# Patient Record
Sex: Female | Born: 1935 | Hispanic: No | Marital: Single | Smoking: Never smoker
Health system: Southern US, Community
[De-identification: ages and names within clinical notes are randomized; demographics above are authoritative.]

---

## 2002-08-09 ENCOUNTER — Other Ambulatory Visit: Admission: RE | Admit: 2002-08-09 | Discharge: 2002-08-09 | Payer: Self-pay | Admitting: Dermatology

## 2003-06-06 ENCOUNTER — Other Ambulatory Visit: Admission: RE | Admit: 2003-06-06 | Discharge: 2003-06-06 | Payer: Self-pay | Admitting: Dermatology

## 2017-05-13 ENCOUNTER — Emergency Department (HOSPITAL_COMMUNITY): Payer: Medicare Other

## 2017-05-13 ENCOUNTER — Encounter (HOSPITAL_COMMUNITY): Payer: Self-pay | Admitting: Family Medicine

## 2017-05-13 ENCOUNTER — Other Ambulatory Visit: Payer: Self-pay

## 2017-05-13 ENCOUNTER — Inpatient Hospital Stay (HOSPITAL_COMMUNITY)
Admission: EM | Admit: 2017-05-13 | Discharge: 2017-05-19 | DRG: 603 | Disposition: A | Discharge status: Skilled Nursing Facility | Payer: Medicare Other | Attending: Internal Medicine | Admitting: Internal Medicine

## 2017-05-13 DIAGNOSIS — R7989 Other specified abnormal findings of blood chemistry: Secondary | ICD-10-CM

## 2017-05-13 DIAGNOSIS — I10 Essential (primary) hypertension: Secondary | ICD-10-CM | POA: Diagnosis present

## 2017-05-13 DIAGNOSIS — I4891 Unspecified atrial fibrillation: Secondary | ICD-10-CM | POA: Diagnosis not present

## 2017-05-13 DIAGNOSIS — L539 Erythematous condition, unspecified: Secondary | ICD-10-CM

## 2017-05-13 DIAGNOSIS — R609 Edema, unspecified: Secondary | ICD-10-CM

## 2017-05-13 DIAGNOSIS — N289 Disorder of kidney and ureter, unspecified: Secondary | ICD-10-CM | POA: Diagnosis not present

## 2017-05-13 DIAGNOSIS — I2510 Atherosclerotic heart disease of native coronary artery without angina pectoris: Secondary | ICD-10-CM | POA: Diagnosis present

## 2017-05-13 DIAGNOSIS — L0390 Cellulitis, unspecified: Secondary | ICD-10-CM | POA: Diagnosis present

## 2017-05-13 DIAGNOSIS — N179 Acute kidney failure, unspecified: Secondary | ICD-10-CM | POA: Diagnosis present

## 2017-05-13 DIAGNOSIS — L03115 Cellulitis of right lower limb: Secondary | ICD-10-CM | POA: Diagnosis present

## 2017-05-13 DIAGNOSIS — I482 Chronic atrial fibrillation: Secondary | ICD-10-CM | POA: Diagnosis present

## 2017-05-13 DIAGNOSIS — E875 Hyperkalemia: Secondary | ICD-10-CM | POA: Diagnosis present

## 2017-05-13 DIAGNOSIS — E860 Dehydration: Secondary | ICD-10-CM | POA: Diagnosis present

## 2017-05-13 DIAGNOSIS — D696 Thrombocytopenia, unspecified: Secondary | ICD-10-CM | POA: Diagnosis present

## 2017-05-13 DIAGNOSIS — E872 Acidosis: Secondary | ICD-10-CM | POA: Diagnosis present

## 2017-05-13 LAB — CBC WITH DIFFERENTIAL/PLATELET
Basophils Absolute: 0 10*3/uL (ref 0.0–0.1)
Basophils Relative: 0 %
EOS PCT: 0 %
Eosinophils Absolute: 0 10*3/uL (ref 0.0–0.7)
HCT: 45.3 % (ref 36.0–46.0)
HEMOGLOBIN: 14.7 g/dL (ref 12.0–15.0)
LYMPHS ABS: 0.3 10*3/uL — AB (ref 0.7–4.0)
LYMPHS PCT: 2 %
MCH: 31.1 pg (ref 26.0–34.0)
MCHC: 32.5 g/dL (ref 30.0–36.0)
MCV: 96 fL (ref 78.0–100.0)
Monocytes Absolute: 0.7 10*3/uL (ref 0.1–1.0)
Monocytes Relative: 6 %
Neutro Abs: 11.3 10*3/uL — ABNORMAL HIGH (ref 1.7–7.7)
Neutrophils Relative %: 92 %
PLATELETS: 140 10*3/uL — AB (ref 150–400)
RBC: 4.72 MIL/uL (ref 3.87–5.11)
RDW: 16 % — ABNORMAL HIGH (ref 11.5–15.5)
WBC: 12.3 10*3/uL — AB (ref 4.0–10.5)

## 2017-05-13 LAB — BASIC METABOLIC PANEL
Anion gap: 8 (ref 5–15)
BUN: 33 mg/dL — AB (ref 6–20)
CHLORIDE: 103 mmol/L (ref 101–111)
CO2: 26 mmol/L (ref 22–32)
Calcium: 9.5 mg/dL (ref 8.9–10.3)
Creatinine, Ser: 1.75 mg/dL — ABNORMAL HIGH (ref 0.44–1.00)
GFR calc Af Amer: 28 mL/min — ABNORMAL LOW (ref >60)
GFR calc non Af Amer: 24 mL/min — ABNORMAL LOW (ref >60)
GLUCOSE: 87 mg/dL (ref 65–99)
POTASSIUM: 5.1 mmol/L (ref 3.5–5.1)
Sodium: 137 mmol/L (ref 135–145)

## 2017-05-13 LAB — I-STAT CG4 LACTIC ACID, ED: Lactic Acid, Venous: 4.83 mmol/L (ref 0.5–1.9)

## 2017-05-13 MED ORDER — ACETAMINOPHEN 325 MG PO TABS
650.0000 mg | ORAL_TABLET | Freq: Four times a day (QID) | ORAL | Status: DC | PRN
  Administered 2017-05-16 – 2017-05-18 (×5): 650 mg via ORAL
  Filled 2017-05-13 (×5): qty 2

## 2017-05-13 MED ORDER — SENNOSIDES-DOCUSATE SODIUM 8.6-50 MG PO TABS: 1.0000 | ORAL_TABLET | Freq: Every evening | ORAL | Status: DC | PRN

## 2017-05-13 MED ORDER — HYDROCODONE-ACETAMINOPHEN 5-325 MG PO TABS
1.0000 | ORAL_TABLET | ORAL | Status: DC | PRN
  Administered 2017-05-14 – 2017-05-15 (×3): 1 via ORAL
  Administered 2017-05-16: 2 via ORAL
  Administered 2017-05-16 (×2): 1 via ORAL
  Administered 2017-05-17 (×2): 2 via ORAL
  Filled 2017-05-13 (×3): qty 1
  Filled 2017-05-13: qty 2
  Filled 2017-05-13 (×3): qty 1
  Filled 2017-05-13 (×2): qty 2

## 2017-05-13 MED ORDER — PIPERACILLIN-TAZOBACTAM 3.375 G IVPB
3.3750 g | Freq: Three times a day (TID) | INTRAVENOUS | Status: DC
  Administered 2017-05-13 – 2017-05-16 (×9): 3.375 g via INTRAVENOUS
  Filled 2017-05-13 (×9): qty 50

## 2017-05-13 MED ORDER — APIXABAN 2.5 MG PO TABS
2.5000 mg | ORAL_TABLET | Freq: Two times a day (BID) | ORAL | Status: DC
  Administered 2017-05-14 – 2017-05-19 (×11): 2.5 mg via ORAL
  Filled 2017-05-13 (×11): qty 1

## 2017-05-13 MED ORDER — BISACODYL 5 MG PO TBEC: 5.0000 mg | DELAYED_RELEASE_TABLET | Freq: Every day | ORAL | Status: DC | PRN

## 2017-05-13 MED ORDER — SODIUM CHLORIDE 0.9 % IV SOLN
INTRAVENOUS | Status: AC
  Administered 2017-05-14: 01:00:00 via INTRAVENOUS

## 2017-05-13 MED ORDER — ACETAMINOPHEN 650 MG RE SUPP: 650.0000 mg | Freq: Four times a day (QID) | RECTAL | Status: DC | PRN

## 2017-05-13 MED ORDER — HEPARIN SODIUM (PORCINE) 5000 UNIT/ML IJ SOLN: 5000.0000 [IU] | Freq: Three times a day (TID) | INTRAMUSCULAR | Status: DC

## 2017-05-13 MED ORDER — ONDANSETRON HCL 4 MG PO TABS: 4.0000 mg | ORAL_TABLET | Freq: Four times a day (QID) | ORAL | Status: DC | PRN

## 2017-05-13 MED ORDER — ONDANSETRON HCL 4 MG/2ML IJ SOLN: 4.0000 mg | Freq: Four times a day (QID) | INTRAMUSCULAR | Status: DC | PRN

## 2017-05-13 MED ORDER — SODIUM CHLORIDE 0.9 % IV BOLUS (SEPSIS)
1000.0000 mL | Freq: Once | INTRAVENOUS | Status: AC
  Administered 2017-05-13: 1000 mL via INTRAVENOUS

## 2017-05-13 MED ORDER — VANCOMYCIN HCL IN DEXTROSE 1-5 GM/200ML-% IV SOLN
1000.0000 mg | Freq: Once | INTRAVENOUS | Status: AC
  Administered 2017-05-13: 1000 mg via INTRAVENOUS
  Filled 2017-05-13: qty 200

## 2017-05-13 NOTE — Progress Notes (Signed)
Pharmacy Antibiotic Note  Lori Whitney is a L[age] Peck[age] y.o. female admitted on 05/13/2017 with cellulitis.  Pharmacy has been consulted for Vancomycin/Zosyn dosing. WBC elevated. Noted renal dysfunction. Lactic acid elevated.   Plan: Vancomycin 1000 mg IV q48h, change to q24h interval if renal function improves  Zosyn 3.375G IV q8h to be infused over 4 hours Trend WBC, temp, renal function  F/U infectious work-up Drug levels as indicated   Height: 5\' 7"  (170.2 cm) Weight: 140 lb (63.5 kg) IBW/kg (Calculated) : 61.6  Temp (24hrs), Avg:96.9 F (36.1 C), Min:96 F (35.6 C), Max:97.8 F (36.6 C)  Recent Labs  Lab 05/13/17 2143 05/13/17 2205  WBC 12.3*  --   CREATININE 1.75*  --   LATICACIDVEN  --  4.83*    Estimated Creatinine Clearance: 20.4 mL/min (A) (by C-G formula based on SCr of 1.75 mg/dL (H)).    No Known Allergies  Abran DukeLedford, Alisyn Lequire 05/13/2017 11:58 PM

## 2017-05-13 NOTE — H&P (Signed)
History and Physical    MATILYNN DACEY ZOX:[id number] DOB: 05-06-1924 DOA: 05/13/2017  PCP: Patient, No Pcp Per   Patient coming from: Home  Chief Complaint: RLE redness, swelling, and pain  HPI: Lori Whitney is a 83 y.o. female with medical history significant for coronary artery disease and atrial fibrillation, previously on Eliquis until [facility] last year, now presenting to the emergency department for evaluation of progressive swelling, erythema, and pain involving the right lower extremity.  Patient has had poorly healing ulcerations to bilateral lower extremities, was recently evaluated in outside hospital with negative venous Dopplers, and was recommended to follow-up with vascular surgery.  She now presents with concern for progression of erythema approximately, from the lower leg, now up into the groin.  There has not been any fevers associated with this, but there is increasing pain, swelling, and drainage.  ED Course: Upon arrival to the ED, patient is found to be afebrile, saturating well on room air, and with vitals otherwise stable.  Radiographs of the right lower extremity notable for soft tissue edema, but no underlying osseous abnormality.  Chemistry panel reveals a creatinine of 1.75, up from 1.1 in 2017.  CBC is notable for leukocytosis of 12,300 and slight thrombocytopenia.  Lactic acid is elevated to 4.83.  Blood cultures were collected and the patient was given a dose of vancomycin in the ED.  She remained hemodynamically stable, and no apparent respiratory distress, and will be admitted to the medical/surgical unit for ongoing evaluation and management of nonhealing lower extremity wounds with superimposed cellulitis.  Review of Systems:  All other systems reviewed and apart from HPI, are negative.  History reviewed. No pertinent past medical history.  History reviewed. No pertinent surgical history.   has no tobacco, alcohol, and drug history on file.  No Known  Allergies  History reviewed. No pertinent family history.   Prior to Admission medications   Not on File    Physical Exam: Vitals:   05/13/17 2145 05/13/17 2200 05/13/17 2215 05/13/17 2230  BP: 113/63 117/76 110/70 101/68  Pulse: 84 85 85 81  Resp:    16  Temp:      TempSrc:      SpO2: 97% 98% 99% 97%  Weight:      Height:          Constitutional: NAD, calm, comfortable Eyes: PERTLA, lids and conjunctivae normal ENMT: Mucous membranes are moist. Posterior pharynx clear of any exudate or lesions.   Neck: normal, supple, no masses, no thyromegaly Respiratory: clear to auscultation bilaterally, no wheezing, no crackles. Normal respiratory effort. No accessory muscle use.  Cardiovascular: S1 & S2 heard, regular rate and rhythm. No significant JVD. Abdomen: No distension, no tenderness, no masses palpated. Bowel sounds normal.  Musculoskeletal: no clubbing / cyanosis. No joint deformity upper and lower extremities.    Skin: Erythema and edema to bilateral lower legs Rt >> Lt. Ulceration to anterior lower right leg with bullae and sloughing. Skin otherwise warm, dry, well-perfused. Neurologic: Gross hearing deficit, CN II-XII otherwise intact. Sensation intact. Strength 5/5 in all 4 limbs.  Psychiatric: Alert and oriented x 3. Very pleasant, cooperative.    Labs on Admission: I have personally reviewed following labs and imaging studies  CBC: Recent Labs  Lab 05/13/17 2143  WBC 12.3*  NEUTROABS 11.3*  HGB 14.7  HCT 45.3  MCV 96.0  PLT 140*   Basic Metabolic Panel: Recent Labs  Lab 05/13/17 2143  NA 137  K 5.1  CL 103  CO2 26  GLUCOSE 87  BUN 33*  CREATININE 1.75*  CALCIUM 9.5   GFR: Estimated Creatinine Clearance: 20.4 mL/min (A) (by C-G formula based on SCr of 1.75 mg/dL (H)). Liver Function Tests: No results for input(s): AST, ALT, ALKPHOS, BILITOT, PROT, ALBUMIN in the last 168 hours. No results for input(s): LIPASE, AMYLASE in the last 168 hours. No  results for input(s): AMMONIA in the last 168 hours. Coagulation Profile: No results for input(s): INR, PROTIME in the last 168 hours. Cardiac Enzymes: No results for input(s): CKTOTAL, CKMB, CKMBINDEX, TROPONINI in the last 168 hours. BNP (last 3 results) No results for input(s): PROBNP in the last 8760 hours. HbA1C: No results for input(s): HGBA1C in the last 72 hours. CBG: No results for input(s): GLUCAP in the last 168 hours. Lipid Profile: No results for input(s): CHOL, HDL, LDLCALC, TRIG, CHOLHDL, LDLDIRECT in the last 72 hours. Thyroid Function Tests: No results for input(s): TSH, T4TOTAL, FREET4, T3FREE, THYROIDAB in the last 72 hours. Anemia Panel: No results for input(s): VITAMINB12, FOLATE, FERRITIN, TIBC, IRON, RETICCTPCT in the last 72 hours. Urine analysis: No results found for: COLORURINE, APPEARANCEUR, LABSPEC, PHURINE, GLUCOSEU, HGBUR, BILIRUBINUR, KETONESUR, PROTEINUR, UROBILINOGEN, NITRITE, LEUKOCYTESUR Sepsis Labs: @LABRCNTIP (procalcitonin:4,lacticidven:4) )No results found for this or any previous visit (from the past 240 hour(s)).   Radiological Exams on Admission: Dg Tibia/fibula Right Port  Result Date: 05/13/2017 CLINICAL DATA:  Right leg erythema. Right leg swelling and increased redness. Recent evaluation at outside institution for same. EXAM: PORTABLE RIGHT TIBIA AND FIBULA - 2 VIEW COMPARISON:  None. FINDINGS: There is no evidence of fracture or other focal bone lesions. Degenerative changes of the knee with chondrocalcinosis. No bony destructive change. Diffuse soft tissue edema with more focal soft tissue prominence about the medial soft tissues of the distal lower leg. There are vascular calcifications. No definite soft tissue air. IMPRESSION: Soft tissue edema. More focal soft tissue prominence about the medial distal lower leg is nonspecific, could reflect abscess in the appropriate clinical setting. No acute osseous abnormalities. Electronically Signed    By: Rubye OaksMelanie  Ehinger M.D.   On: 05/13/2017 22:29    EKG: Not performed.   Assessment/Plan  1. Cellulitis  - Pt presents with non-healing lower leg wounds and increasing pain, redness, and drainage from RLE  - She is found to have leukocytosis and and elevated lactic acid  - Venous dopplers reportedly negative for DVT at outside hospital 2 days prior to admission  - Blood cultures collected in ED and vancomycin administered  - Given the extensive skin sloughing and lactic acidosis, she will be continued on empiric abx with vancomycin and Zosyn initially  - Check ABI's, consult wound care, trend lactate and clinical response to therapy  2. Renal insufficiency  - SCr is 1.75 on admission, up from 1.1 range in 2017 with no recent values available  - There is acute infection with elevated lactic acid and patient will be hydrated with normal saline  - Renally-dose medications as needed, avoid nephrotoxins where possible, repeat chem panel in am   3. Atrial fibrillation  - In a regular rhythm on admission   - CHADS-VASc at least 424 (age x2, CAD, gender) - [facility]    DVT prophylaxis: sq heparin  Code Status: Full  Family Communication: Son updated at bedside Disposition Plan: Admit to med-surg Consults called: None Admission status: Inpatient    Briscoe Deutscherimothy S Khloie Hamada, MD [facility] Pager [phone]  If 7PM-7AM,  please contact night-coverage [email or web address] Password [email or web address]  05/13/2017, 11:13 PM

## 2017-05-13 NOTE — ED Provider Notes (Signed)
[facility] Provider Note   CSN: [id number] Arrival date & time: 05/13/17  2016    History   Chief Complaint Chief Complaint  Patient presents with  . Leg Swelling    HPI Lori Whitney is a 83 y.o. female.  83 y/o female presents to the ED for worsening BLE swelling and weeping, R>L. Son reports this to be progressive over the last few weeks. She had a large amount of drainage from her legs 2 days ago and [facility]. Son states that the patient had an ultrasound which was negative for blood clot. He became concerned about increased redness to the RLE today, now streaking up the inner thigh, though there has been a degree of erythema for some time. No documented fevers. Patient notes pain in BLE with weight bearing.  She is on chronic Eliquis; son is unsure why.      No past medical history on file.  Patient Active Problem List   Diagnosis Date Noted  . Cellulitis 05/13/2017    ** The histories are not reviewed yet. Please review them in the "History" navigator section and refresh this SmartLink.  OB History    No data available       Home Medications    Prior to Admission medications   Not on File    Family History No family history on file.  Social History Social History   Tobacco Use  . Smoking status: Not on file  Substance Use Topics  . Alcohol use: Not on file  . Drug use: Not on file     Allergies   Patient has no known allergies.   Review of Systems Review of Systems Ten systems reviewed and are negative for acute change, except as noted in the HPI.    Physical Exam Updated Vital Signs BP 101/68   Pulse 81   Temp 97.8 F (36.6 C) (Oral)   Resp 16   Ht 5\' 7"  (1.702 m)   Wt 63.5 kg (140 lb)   SpO2 97%   BMI 21.93 kg/m   Physical Exam  Constitutional: She is oriented to person, place, and time. She appears well-developed and well-nourished. No distress.    Nontoxic and in NAD  HENT:  Head: Normocephalic and atraumatic.  Eyes: Conjunctivae and EOM are normal. No scleral icterus.  Neck: Normal range of motion.  Cardiovascular: Normal rate, regular rhythm and intact distal pulses.  DP pulse 1+ in BLE  Pulmonary/Chest: Effort normal. No stridor. No respiratory distress.  Respirations even and unlabored  Musculoskeletal: Normal range of motion.  Edematous BLE weeping copious amounts of serosanguinous fluid. Associated erythema, blanching. Also scattered hematomas. Ulceration to the lateral right lower extremity. Skin peeling and sloughing. See attached images.  Neurological: She is alert and oriented to person, place, and time. She exhibits normal muscle tone. Coordination normal.  Skin: Skin is warm and dry. She is not diaphoretic.  Psychiatric: She has a normal mood and affect. Her behavior is normal.  Nursing note and vitals reviewed.       ED Treatments / Results  Labs (all labs ordered are listed, but only abnormal results are displayed) Labs Reviewed  CBC WITH DIFFERENTIAL/PLATELET - Abnormal; Notable for the following components:      Result Value   WBC 12.3 (*)    RDW 16.0 (*)    Platelets 140 (*)    Neutro Abs 11.3 (*)    Lymphs Abs 0.3 (*)  All other components within normal limits  BASIC METABOLIC PANEL - Abnormal; Notable for the following components:   BUN 33 (*)    Creatinine, Ser 1.75 (*)    GFR calc non Af Amer 24 (*)    GFR calc Af Amer 28 (*)    All other components within normal limits  I-STAT CG4 LACTIC ACID, ED - Abnormal; Notable for the following components:   Lactic Acid, Venous 4.83 (*)    All other components within normal limits  CULTURE, BLOOD (ROUTINE X 2)  CULTURE, BLOOD (ROUTINE X 2)    EKG  EKG Interpretation None       Radiology Dg Tibia/fibula Right Port  Result Date: 05/13/2017 CLINICAL DATA:  Right leg erythema. Right leg swelling and increased redness. Recent evaluation at  outside institution for same. EXAM: PORTABLE RIGHT TIBIA AND FIBULA - 2 VIEW COMPARISON:  None. FINDINGS: There is no evidence of fracture or other focal bone lesions. Degenerative changes of the knee with chondrocalcinosis. No bony destructive change. Diffuse soft tissue edema with more focal soft tissue prominence about the medial soft tissues of the distal lower leg. There are vascular calcifications. No definite soft tissue air. IMPRESSION: Soft tissue edema. More focal soft tissue prominence about the medial distal lower leg is nonspecific, could reflect abscess in the appropriate clinical setting. No acute osseous abnormalities. Electronically Signed   By: Rubye Oaks M.D.   On: 05/13/2017 22:29    Procedures Procedures (including critical care time)  Medications Ordered in ED Medications  vancomycin (VANCOCIN) IVPB 1000 mg/200 mL premix (1,000 mg Intravenous New Bag/Given 05/13/17 2256)     Initial Impression / Assessment and Plan / ED Course  I have reviewed the triage vital signs and the nursing notes.  Pertinent labs & imaging results that were available during my care of the patient were reviewed by me and considered in my medical decision making (see chart for details).    50:71 PM 83 year old female presents to the emergency department for evaluation of worsening bilateral lower extremity edema and erythema.  Son states that erythema and edema of the right leg have significantly worsened over the past 2 days.  Patient has some erythema extending up the inner aspect of her thigh.  This is associated with pitting edema as well.  Patient with difficulty weightbearing due to swelling and secondary pain. This is not reproducible on palpation, per se. Patient is neurovascularly intact. Will obtain labs, screening Xray. Suspect PVD, venous insufficiency. Question secondary cellulitis. Son states she had a negative venous duplex study at [facility] 2 days ago. She is on chronic  Eliquis.  10:35 PM No foci of air on Xray. Edema is noted. Unable to definitively exclude cellulitis. This is certainly possible given loss of skin integrity with multiple open wounds. Leukocytosis is nonspecific, but could also support a diagnosis of cellulitis. Lower suspicion for necrotizing fascitis given Xray findings, absence of pain out of proportion, lack of fever, hemodynamic stability, symptom chronicity.  Given age, symptom severity, leukocytosis, elevated lactate, I believe admission would be in the best interest of the patient. Will hold on Vascular Surgery consultation as I do not see what their input would add on an emergent basis, though consultation at some point during inpatient course may be beneficial.  11:05 PM Case discussed with Dr. Antionette Char who will admit.   Final Clinical Impressions(s) / ED Diagnoses   Final diagnoses:  Peripheral edema  Cellulitis of right lower extremity  Elevated serum  creatinine    ED Discharge Orders    None          Antony MaduraHumes, Timica Marcom, PA-C 05/13/17 2309    Melene PlanFloyd, Dan, DO 05/13/17 2323

## 2017-05-13 NOTE — ED Triage Notes (Signed)
Patient arrives with complaint of primarily right leg swelling and worsened reddening. States has been under evaluation for this recently. [facility] and was directed to seek vascular surgery care. Currently LE weeping and fluid draining from legs onto floor. Since yesterday redness has progressed up leg and into groin.

## 2017-05-14 ENCOUNTER — Other Ambulatory Visit: Payer: Self-pay

## 2017-05-14 ENCOUNTER — Encounter (HOSPITAL_COMMUNITY): Payer: Self-pay

## 2017-05-14 LAB — BLOOD CULTURE ID PANEL (REFLEXED)

## 2017-05-14 LAB — BASIC METABOLIC PANEL
ANION GAP: 7 (ref 5–15)
BUN: 35 mg/dL — ABNORMAL HIGH (ref 6–20)
CALCIUM: 8.7 mg/dL — AB (ref 8.9–10.3)
CHLORIDE: 106 mmol/L (ref 101–111)
CO2: 24 mmol/L (ref 22–32)
Creatinine, Ser: 1.65 mg/dL — ABNORMAL HIGH (ref 0.44–1.00)
GFR calc non Af Amer: 26 mL/min — ABNORMAL LOW (ref >60)
GFR, EST AFRICAN AMERICAN: 30 mL/min — AB (ref >60)
Glucose, Bld: 88 mg/dL (ref 65–99)
Potassium: 5.1 mmol/L (ref 3.5–5.1)
Sodium: 137 mmol/L (ref 135–145)

## 2017-05-14 LAB — CBC WITH DIFFERENTIAL/PLATELET
BASOS PCT: 0 %
Band Neutrophils: 0 %
Basophils Absolute: 0 10*3/uL (ref 0.0–0.1)
Blasts: 0 %
EOS ABS: 0 10*3/uL (ref 0.0–0.7)
Eosinophils Relative: 0 %
HEMATOCRIT: 40.3 % (ref 36.0–46.0)
HEMOGLOBIN: 13.3 g/dL (ref 12.0–15.0)
Lymphocytes Relative: 1 %
Lymphs Abs: 0.1 10*3/uL — ABNORMAL LOW (ref 0.7–4.0)
MCH: 31.1 pg (ref 26.0–34.0)
MCHC: 33 g/dL (ref 30.0–36.0)
MCV: 94.2 fL (ref 78.0–100.0)
Metamyelocytes Relative: 0 %
Monocytes Absolute: 0.7 10*3/uL (ref 0.1–1.0)
Monocytes Relative: 6 %
Myelocytes: 0 %
NEUTROS PCT: 93 %
NRBC: 0 /100{WBCs}
Neutro Abs: 11.6 10*3/uL — ABNORMAL HIGH (ref 1.7–7.7)
PROMYELOCYTES ABS: 0 %
Platelets: 153 10*3/uL (ref 150–400)
RBC: 4.28 MIL/uL (ref 3.87–5.11)
RDW: 15.8 % — AB (ref 11.5–15.5)
WBC Morphology: INCREASED
WBC: 12.4 10*3/uL — ABNORMAL HIGH (ref 4.0–10.5)

## 2017-05-14 LAB — LACTIC ACID, PLASMA
Lactic Acid, Venous: 2.9 mmol/L (ref 0.5–1.9)
Lactic Acid, Venous: 2.3 mmol/L (ref 0.5–1.9)

## 2017-05-14 MED ORDER — VANCOMYCIN HCL IN DEXTROSE 1-5 GM/200ML-% IV SOLN: 1000.0000 mg | INTRAVENOUS | Status: DC

## 2017-05-14 NOTE — ED Notes (Signed)
Pt not in room for blood draw.

## 2017-05-14 NOTE — Progress Notes (Signed)
PROGRESS NOTE    Lori PeckMargaret W Whitney  ZOX:[id number] DOB: [birth year] DOA: 05/13/2017 PCP: Patient, No Pcp Per   Specialists:     Brief Narrative:  83 year old female Previous history of trauma falling to face 01/04/16 [facility] [place] Chronic atrial fibrillation on long-term anticoagulation chads score >3 until subarachnoid Coronary artery disease Admitted with erythema right lower leg right lower extremity showed soft tissue edema  [facility] 2 days prior to admission with negative Doppler studies   On admit BUN/creatinine elevated creatinine 1.7 up from 1.1 WBC 12.3 Lactic acid 4.8 Started on vancomycin in the emergency room   Assessment & Plan:   Principal Problem:   Cellulitis Active Problems:   Renal insufficiency   Unspecified atrial fibrillation (HCC)   Cellulitis Secondary to nonhealing left lower extremity wounds--has had wounds in LE's for the past 6-8 months--she is managed --has been draining and looking worse 3-4 months--[place] Started on vancomycin and continued on Zosyn ABIs to be checked wound care consult is currently pending Can probably transition to oral abx based on look of wounds in am 11/25 --might need vasc input, but would probably need to heal first  Lactic acidosis Probably secondary to moderate dehydration + sepsis Repeat lactic acid from 4.8-->2.3 currently  Renal insufficiency baseline creatinine 1.1 Currently up to 1.7--1.6 with rehydration Hydrate with normal saline 100 cc/h Monitor trends has mild hyperkalemia as well but is not on any oral potassium supplementation so monitor the same  Chr Afib not on AC sys murmur 4/6 Rate controlled at this time [facility]     DVT prophylaxis: lovenox Code Status:  full Family Communication:  D/w son on phone Disposition Plan: possible d/c  48   Consultants:    woc nurse  Procedures:    abi pending  Antimicrobials:   Vancomycin and zosyn    Subjective:  Awake alert and Very HOH No CP Pain in LE's is mild only at current time  Objective: Vitals:   05/13/17 2345 05/14/17 0000 05/14/17 0015 05/14/17 0104  BP: 114/65 124/67 (!) 109/94 122/84  Pulse: 85 81 80 80  Resp:      Temp:    99 F (37.2 C)  TempSrc:    Oral  SpO2: 98% 96% 94% 100%  Weight:      Height:        Intake/Output Summary (Last 24 hours) at 05/14/2017 0747 Last data filed at 05/14/2017 0300 Gross per 24 hour  Intake 120 ml  Output -  Net 120 ml   Filed Weights   05/13/17 2028  Weight: 63.5 kg (140 lb)    Examination:  Alert nad  Arcus-no pallor no ict cta b abd sfot nt nd no rebound LE's not examined today   Data Reviewed: I have personally reviewed following labs and imaging studies  CBC: Recent Labs  Lab 05/13/17 2143 05/14/17 0444  WBC 12.3* 12.4*  NEUTROABS 11.3* 11.6*  HGB 14.7 13.3  HCT 45.3 40.3  MCV 96.0 94.2  PLT 140* 153   Basic Metabolic Panel: Recent Labs  Lab 05/13/17 2143 05/14/17 0444  NA 137 137  K 5.1 5.1  CL 103 106  CO2 26 24  GLUCOSE 87 88  BUN 33* 35*  CREATININE 1.75* 1.65*  CALCIUM 9.5 8.7*   GFR: Estimated Creatinine Clearance: 21.6 mL/min (A) (by C-G formula based on SCr of 1.65  mg/dL (H)). Liver Function Tests: No results for input(s): AST, ALT, ALKPHOS, BILITOT, PROT, ALBUMIN in the last 168 hours. No results for input(s): LIPASE, AMYLASE in the last 168 hours. No results for input(s): AMMONIA in the last 168 hours. Coagulation Profile: No results for input(s): INR, PROTIME in the last 168 hours. Cardiac Enzymes: No results for input(s): CKTOTAL, CKMB, CKMBINDEX, TROPONINI in the last 168 hours. BNP (last 3 results) No results for input(s): PROBNP in the last 8760 hours. HbA1C: No results for input(s): HGBA1C in the last 72 hours. CBG: No results for input(s):  GLUCAP in the last 168 hours. Lipid Profile: No results for input(s): CHOL, HDL, LDLCALC, TRIG, CHOLHDL, LDLDIRECT in the last 72 hours. Thyroid Function Tests: No results for input(s): TSH, T4TOTAL, FREET4, T3FREE, THYROIDAB in the last 72 hours. Anemia Panel: No results for input(s): VITAMINB12, FOLATE, FERRITIN, TIBC, IRON, RETICCTPCT in the last 72 hours. Urine analysis: No results found for: COLORURINE, APPEARANCEUR, LABSPEC, PHURINE, GLUCOSEU, HGBUR, BILIRUBINUR, KETONESUR, PROTEINUR, UROBILINOGEN, NITRITE, LEUKOCYTESUR   Radiology Studies: Reviewed images personally in health database    Scheduled Meds: . apixaban  2.5 mg Oral BID   Continuous Infusions: . sodium chloride 110 mL/hr at 05/14/17 0113  . piperacillin-tazobactam (ZOSYN)  IV 3.375 g (05/14/17 0548)  . [START ON 05/15/2017] vancomycin       LOS: 1 day    Time spent: 5325    Pleas KochJai Ulises Wolfinger, MD [facility](P) [phone]   If 7PM-7AM, please contact night-coverage [email or web address] Password [email or web address] 05/14/2017, 7:47 AM

## 2017-05-14 NOTE — Progress Notes (Signed)
.  PHARMACY - PHYSICIAN COMMUNICATION CRITICAL VALUE ALERT - BLOOD CULTURE IDENTIFICATION (BCID)  No results found for this or any previous visit.  Patient has gram negative rods on 1/2 blood cultures (BCID negative)  Name of physician (or Provider) Contacted: Blount, NP  ChangBruna Potteres to prescribed antibiotics required: On Zosyn. Could consider discontinuing Vancomycin  Della GooEmily S Sinclair, PharmD, BCPS PGY2 Infectious Diseases Pharmacy Resident Phone: X [phone] 05/14/2017  7:33 PM

## 2017-05-15 ENCOUNTER — Encounter (HOSPITAL_COMMUNITY): Payer: Self-pay | Admitting: *Deleted

## 2017-05-15 ENCOUNTER — Other Ambulatory Visit: Payer: Self-pay

## 2017-05-15 ENCOUNTER — Encounter (HOSPITAL_COMMUNITY): Payer: Medicare Other

## 2017-05-15 MED ORDER — ENSURE ENLIVE PO LIQD
237.0000 mL | Freq: Two times a day (BID) | ORAL | Status: DC
  Administered 2017-05-16 – 2017-05-19 (×5): 237 mL via ORAL

## 2017-05-15 MED ORDER — COLLAGENASE 250 UNIT/GM EX OINT
TOPICAL_OINTMENT | Freq: Every day | CUTANEOUS | Status: DC
  Administered 2017-05-15: 09:00:00 via TOPICAL
  Administered 2017-05-16: 1 via TOPICAL
  Administered 2017-05-17 – 2017-05-19 (×3): via TOPICAL
  Filled 2017-05-15: qty 30

## 2017-05-15 NOTE — Progress Notes (Signed)
PT Cancellation Note  Patient Details Name: Lori PeckMargaret W Whitney MRN: [id number] DOB: 26-Jun-1923   Cancelled Treatment:    Reason Eval/Treat Not Completed: Fatigue/lethargy limiting ability to participate   On arrival RN stated patient had just been put back to bed after sitting up from [phone]. She reported the patient did not bear weight well on her leg and could not process how to sequence stepping her feet from chair to bed. Agreed PT will return tomorrow to complete evaluation.   Scherrie NovemberLynn P Milo Schreier, PT 05/15/2017, 3:04 PM

## 2017-05-15 NOTE — Consult Note (Signed)
WOC Nurse wound consult note Reason for Consult:Cellulitis to right lower legs present on admission.  Right >left.  Wound type:infectious Pressure Injury POA: NA Measurement:Left lower leg:  2 cm x 1 cm x 0.2 cm  Right lower leg: Medial aspect  Serum filled blister 2 cm x 2 cm  Right anterior leg: 5 cm x 5 cm 100% fibrin slough Right lower distal leg:  Ruptured blister and peeling epithelium:  12 cm x 5 cm  Wound AOZ:HYQMVbed:ruddy red Drainage (amount, consistency, odor) moderate serosanguinous weeping Periwound:Edema, erythema and tender to touch.  Patient is confused to place and situation and yells and resists care Dressing procedure/placement/frequency:Cleanse left leg with NS.  Apply silicone border foam to nonintact lesion.  Change every three days and PRN soilage Cleanse right leg with soap and water.  Apply Santyl to wound bed right anterior leg wound below knee.  COver with NS moist gauze.  Secure with 4x4 gauze and kerlix.  Mepitel silicone contact layer to wounds on medial and lateral aspects of leg for atrauamtic dressing removal.  Cover with 4x4 gauze and kerlix.  Change daily.  Will not follow at this time.  Please re-consult if needed.  Maple HudsonKaren Delbert Darley RN BSN CWON Pager [phone]

## 2017-05-15 NOTE — Progress Notes (Signed)
PROGRESS NOTE    Lori PeckMargaret W Whitney  ZOX:[id number] DOB: [birth year] DOA: 05/13/2017 PCP: Patient, No Pcp Per   Specialists:     Brief Narrative:  83 year old female Previous history of trauma falling to face 01/04/16 suffering [facility] [place] Chronic atrial fibrillation prior Coumadin chads score >3 until subarachnoid hemorrhage Coronary artery disease Admitted with erythema right lower leg right lower extremity showed soft tissue edema  Previously been [facility] 2 days prior to admission with negative Doppler studies   On admit BUN/creatinine elevated creatinine 1.7 up from 1.1 WBC 12.3 Lactic acid 4.8 Started on vancomycin in the emergency room   Assessment & Plan:   Principal Problem:   Cellulitis Active Problems:   Renal insufficiency   Unspecified atrial fibrillation (HCC)   Cellulitis Secondary to nonhealing left lower extremity wounds--has had wounds in LE's for the past 6-8 months--she is managed --has been draining and looking worse 3-4 months--has been seeing [place] wound doctor in [place] Started on vancomycin which was stopped on 11/25 continue on Zosyn ABIs to be checked still wound care consult appreciated Continue IV abx for now  Lactic acidosis Probably secondary to moderate dehydration + sepsis Repeat lactic acid from 4.8-->2.3 currently  Renal insufficiency baseline creatinine 1.1 Currently up to 1.7--1.6 with rehydration Hydrate with normal saline 100 cc/h Rpt labs am 11/26  Chr Afib not on AC sys murmur 4/6 Rate controlled at this time [facility]   DVT prophylaxis: lovenox Code Status:  full Family Communication:  No family today Disposition Plan: need sIV abx-still waiting on testign--not ready for d/c Needs therapy eval   Consultants:    woc nurse  Procedures:    abi pending  Antimicrobials:   Vancomycin and zosyn    Subjective:  HOh some  confusion overnight but in nad Eating some Doesn;t have hearing aids in Apparently received dose of narcotics overnight which caused confusion  Objective: Vitals:   05/14/17 0104 05/14/17 1406 05/14/17 2010 05/15/17 0508  BP: 122/84 96/60 119/69 102/63  Pulse: 80 90 87 86  Resp:   16 17  Temp: 99 F (37.2 C) 97.9 F (36.6 C) 98 F (36.7 C) (!) 97.3 F (36.3 C)  TempSrc: Oral Oral Oral   SpO2: 100% 99% 98% 97%  Weight:    62.9 kg (138 lb 10.7 oz)  Height:        Intake/Output Summary (Last 24 hours) at 05/15/2017 1119 Last data filed at 05/15/2017 0911 Gross per 24 hour  Intake 780 ml  Output 240 ml  Net 540 ml   Filed Weights   05/13/17 2028 05/15/17 0508  Weight: 63.5 kg (140 lb) 62.9 kg (138 lb 10.7 oz)    Examination:  Alert t but a little confused-some halucnations per RN Arcus-no pallor no ict  cta b abd sfot nt nd no rebound        Data Reviewed: I have personally reviewed following labs and imaging studies  CBC: Recent Labs  Lab 05/13/17 2143 05/14/17 0444  WBC 12.3* 12.4*  NEUTROABS 11.3* 11.6*  HGB 14.7 13.3  HCT 45.3 40.3  MCV 96.0 94.2  PLT 140* 153   Basic Metabolic Panel: Recent Labs  Lab 05/13/17 2143 05/14/17 0444  NA 137 137  K 5.1 5.1  CL 103 106  CO2 26 24  GLUCOSE 87 88  BUN 33* 35*  CREATININE 1.75* 1.65*  CALCIUM 9.5 8.7*   GFR: Estimated Creatinine  Clearance: 21.6 mL/min (A) (by C-G formula based on SCr of 1.65 mg/dL (H)). Liver Function Tests: No results for input(s): AST, ALT, ALKPHOS, BILITOT, PROT, ALBUMIN in the last 168 hours. No results for input(s): LIPASE, AMYLASE in the last 168 hours. No results for input(s): AMMONIA in the last 168 hours. Coagulation Profile: No results for input(s): INR, PROTIME in the last 168 hours. Cardiac Enzymes: No results for input(s): CKTOTAL, CKMB, CKMBINDEX, TROPONINI in the last 168 hours. BNP (last 3 results) No results for input(s): PROBNP in the last 8760  hours. HbA1C: No results for input(s): HGBA1C in the last 72 hours. CBG: No results for input(s): GLUCAP in the last 168 hours. Lipid Profile: No results for input(s): CHOL, HDL, LDLCALC, TRIG, CHOLHDL, LDLDIRECT in the last 72 hours. Thyroid Function Tests: No results for input(s): TSH, T4TOTAL, FREET4, T3FREE, THYROIDAB in the last 72 hours. Anemia Panel: No results for input(s): VITAMINB12, FOLATE, FERRITIN, TIBC, IRON, RETICCTPCT in the last 72 hours. Urine analysis: No results found for: COLORURINE, APPEARANCEUR, LABSPEC, PHURINE, GLUCOSEU, HGBUR, BILIRUBINUR, KETONESUR, PROTEINUR, UROBILINOGEN, NITRITE, LEUKOCYTESUR   Radiology Studies: Reviewed images personally in health database    Scheduled Meds: . apixaban  2.5 mg Oral BID  . collagenase   Topical Daily   Continuous Infusions: . piperacillin-tazobactam (ZOSYN)  IV Stopped (05/15/17 0911)     LOS: 2 days    Time spent: 2025    Pleas KochJai Jessamine Barcia, MD [facility](P) [phone]   If 7PM-7AM, please contact night-coverage [email or web address] Password [email or web address] 05/15/2017, 11:19 AM

## 2017-05-15 NOTE — Progress Notes (Signed)
Noted WOC nurse recommendations for Cellulitis lower extremities.  Changed dressings to BLEs as ordered since moderate serous drainage noted that had come through previous dressing on the right.  Applied bilateral heel foam protectors.  Dependent edema noted bilateral groin areas.  Ensure ordered for pt as supplement.

## 2017-05-16 ENCOUNTER — Encounter (HOSPITAL_COMMUNITY): Payer: Medicare Other

## 2017-05-16 LAB — RENAL FUNCTION PANEL
ANION GAP: 10 (ref 5–15)
Albumin: 2 g/dL — ABNORMAL LOW (ref 3.5–5.0)
BUN: 51 mg/dL — ABNORMAL HIGH (ref 6–20)
CALCIUM: 8.4 mg/dL — AB (ref 8.9–10.3)
CO2: 22 mmol/L (ref 22–32)
Chloride: 103 mmol/L (ref 101–111)
Creatinine, Ser: 2.16 mg/dL — ABNORMAL HIGH (ref 0.44–1.00)
GFR calc Af Amer: 22 mL/min — ABNORMAL LOW (ref >60)
GFR calc non Af Amer: 19 mL/min — ABNORMAL LOW (ref >60)
GLUCOSE: 104 mg/dL — AB (ref 65–99)
Phosphorus: 3.8 mg/dL (ref 2.5–4.6)
Potassium: 4.2 mmol/L (ref 3.5–5.1)
SODIUM: 135 mmol/L (ref 135–145)

## 2017-05-16 LAB — CBC WITH DIFFERENTIAL/PLATELET
BASOS PCT: 0 %
Basophils Absolute: 0 10*3/uL (ref 0.0–0.1)
Eosinophils Absolute: 0 10*3/uL (ref 0.0–0.7)
Eosinophils Relative: 0 %
HEMATOCRIT: 40.8 % (ref 36.0–46.0)
Hemoglobin: 13.5 g/dL (ref 12.0–15.0)
LYMPHS PCT: 4 %
Lymphs Abs: 0.6 10*3/uL — ABNORMAL LOW (ref 0.7–4.0)
MCH: 30.5 pg (ref 26.0–34.0)
MCHC: 33.1 g/dL (ref 30.0–36.0)
MCV: 92.3 fL (ref 78.0–100.0)
MONO ABS: 0.2 10*3/uL (ref 0.1–1.0)
MONOS PCT: 2 %
NEUTROS ABS: 12.3 10*3/uL — AB (ref 1.7–7.7)
Neutrophils Relative %: 94 %
Platelets: 141 10*3/uL — ABNORMAL LOW (ref 150–400)
RBC: 4.42 MIL/uL (ref 3.87–5.11)
RDW: 15.5 % (ref 11.5–15.5)
WBC: 13.1 10*3/uL — ABNORMAL HIGH (ref 4.0–10.5)

## 2017-05-16 LAB — CULTURE, BLOOD (ROUTINE X 2): Special Requests: ADEQUATE

## 2017-05-16 LAB — GLUCOSE, CAPILLARY: Glucose-Capillary: 103 mg/dL — ABNORMAL HIGH (ref 65–99)

## 2017-05-16 MED ORDER — PIPERACILLIN-TAZOBACTAM IN DEX 2-0.25 GM/50ML IV SOLN
2.2500 g | Freq: Three times a day (TID) | INTRAVENOUS | Status: DC
  Filled 2017-05-16 (×2): qty 50

## 2017-05-16 MED ORDER — SODIUM CHLORIDE 0.9 % IV SOLN
INTRAVENOUS | Status: DC
  Administered 2017-05-16 – 2017-05-17 (×3): via INTRAVENOUS

## 2017-05-16 MED ORDER — CLINDAMYCIN HCL 300 MG PO CAPS
300.0000 mg | ORAL_CAPSULE | Freq: Three times a day (TID) | ORAL | Status: DC
  Administered 2017-05-16 – 2017-05-17 (×2): 300 mg via ORAL
  Filled 2017-05-16 (×2): qty 1

## 2017-05-16 NOTE — Progress Notes (Signed)
PROGRESS NOTE    Lorenda PeckMargaret W Manfredonia  GUY:[id number] DOB: Nov 17, 1923 DOA: 05/13/2017 PCP: Patient, No Pcp Per   Specialists:     Brief Narrative:   83 year old female Previous history of trauma falling to face 01/04/16 [facility] [place] Chronic atrial fibrillation prior Coumadin chads score >3 until subarachnoid hemorrhage Coronary artery disease Admitted with erythema right lower leg right lower extremity showed soft tissue edema  [facility] 2 days prior to admission with negative Doppler studies   On admit BUN/creatinine elevated creatinine 1.7 up from 1.1 WBC 12.3 Lactic acid 4.8 Started on vancomycin in the emergency room   Assessment & Plan:   Principal Problem:   Cellulitis Active Problems:   Renal insufficiency   Unspecified atrial fibrillation (HCC)   Cellulitis Secondary to nonhealing left lower extremity wounds--has had wounds in LE's for the past 6-8 months--she is managed --has been draining and looking worse 3-4 months--has [place] Started on vancomycin which was stopped on 11/25 continue on Zosyn ABIs tcannot be checked per Vasc tech as would cause compression of already ulcerated wound--can get OP testing wound care consult appreciated De-escalating to PO clindamycin and monitor wound in am  Lactic acidosis Probably secondary to moderate dehydration + sepsis Repeat lactic acid from 4.8-->2.3 currently  Renal insufficiency baseline creatinine 1.1-->2.3 now Hydrate with normal saline 100 cc/h Rpt labs am 11/26 show a worsening Will need to continue IVF---if worse in am will place foley and re-assess  Chr Afib not on AC sys murmur 4/6 Rate controlled at this time [facility]   DVT prophylaxis: lovenox Code Status:  full Family Communication:  No family today Disposition Plan: need sIV abx-still waiting on testign--not  ready for d/c Needs therapy eval   Consultants:    woc nurse  Procedures:    abi pending  Antimicrobials:   Vancomycin and zosyn    Subjective:  Less confused in chair no distress can orient some but not completely Can tell me in [place] but no [place] Pain is moderate  Objective: Vitals:   05/15/17 0508 05/15/17 1400 05/15/17 2144 05/16/17 0527  BP: 102/63 (!) 119/57 117/80 135/83  Pulse: 86 95 80 91  Resp: 17 17 16 16   Temp: (!) 97.3 F (36.3 C) 97.6 F (36.4 C) 98.1 F (36.7 C) (!) 97.3 F (36.3 C)  TempSrc:  Oral Axillary Axillary  SpO2: 97% 98% 97% 98%  Weight: 62.9 kg (138 lb 10.7 oz)     Height:        Intake/Output Summary (Last 24 hours) at 05/16/2017 1725 Last data filed at 05/16/2017 1358 Gross per 24 hour  Intake 400 ml  Output 500 ml  Net -100 ml   Filed Weights   05/13/17 2028 05/15/17 0508  Weight: 63.5 kg (140 lb) 62.9 kg (138 lb 10.7 oz)    Examination:  Alert-less agitated and confused today Arcus-no pallor no ict  cta b abd sfot nt nd no rebound Wound and lower extremities not examined   Data Reviewed: I have personally reviewed following labs and imaging studies  CBC: Recent Labs  Lab 05/13/17 2143 05/14/17 0444 05/16/17 0619  WBC 12.3* 12.4* 13.1*  NEUTROABS 11.3* 11.6* 12.3*  HGB 14.7 13.3 13.5  HCT 45.3 40.3 40.8  MCV 96.0 94.2 92.3  PLT 140* 153 141*   Basic Metabolic Panel: Recent Labs  Lab 05/13/17 2143  05/14/17 0444 05/16/17 0619  NA 137 137 135  K 5.1 5.1 4.2  CL 103 106 103  CO2 26 24 22   GLUCOSE 87 88 104*  BUN 33* 35* 51*  CREATININE 1.75* 1.65* 2.16*  CALCIUM 9.5 8.7* 8.4*  PHOS  --   --  3.8   GFR: Estimated Creatinine Clearance: 16.5 mL/min (A) (by C-G formula based on SCr of 2.16 mg/dL (H)). Liver Function Tests: Recent Labs  Lab 05/16/17 0619  ALBUMIN 2.0*   No results for input(s): LIPASE, AMYLASE in the last 168 hours. No results for input(s): AMMONIA in the last 168  hours. Coagulation Profile: No results for input(s): INR, PROTIME in the last 168 hours. Cardiac Enzymes: No results for input(s): CKTOTAL, CKMB, CKMBINDEX, TROPONINI in the last 168 hours. BNP (last 3 results) No results for input(s): PROBNP in the last 8760 hours. HbA1C: No results for input(s): HGBA1C in the last 72 hours. CBG: Recent Labs  Lab 05/16/17 0813  GLUCAP 103*   Lipid Profile: No results for input(s): CHOL, HDL, LDLCALC, TRIG, CHOLHDL, LDLDIRECT in the last 72 hours. Thyroid Function Tests: No results for input(s): TSH, T4TOTAL, FREET4, T3FREE, THYROIDAB in the last 72 hours. Anemia Panel: No results for input(s): VITAMINB12, FOLATE, FERRITIN, TIBC, IRON, RETICCTPCT in the last 72 hours. Urine analysis: No results found for: COLORURINE, APPEARANCEUR, LABSPEC, PHURINE, GLUCOSEU, HGBUR, BILIRUBINUR, KETONESUR, PROTEINUR, UROBILINOGEN, NITRITE, LEUKOCYTESUR   Radiology Studies: Reviewed images personally in health database    Scheduled Meds: . apixaban  2.5 mg Oral BID  . collagenase   Topical Daily  . feeding supplement (ENSURE ENLIVE)  237 mL Oral BID BM   Continuous Infusions: . sodium chloride 75 mL/hr at 05/16/17 1414  . piperacillin-tazobactam (ZOSYN)  IV       LOS: 3 days    Time spent: 4325    Pleas KochJai Jochebed Bills, MD [facility](P) [phone]   If 7PM-7AM, please contact night-coverage [email or web address] Password [email or web address] 05/16/2017, 5:25 PM

## 2017-05-16 NOTE — Progress Notes (Signed)
Pharmacy Antibiotic Note  Lori PeckMargaret W Whitney is a 83 y.o. female admitted on 05/13/2017 with cellulitis, morganella morganii bacteremia.  Pharmacy has been consulted for Zosyn dosing - day #3. WBC up 13.1, LA 2.3, afebrile. Noted AKI - SCr up to 2.16.  Plan: Reduce Zosyn to 2.25G IV q8h for worsening renal function Trend WBC, temp, renal function  F/U infectious work-up   Height: 5\' 7"  (170.2 cm) Weight: 138 lb 10.7 oz (62.9 kg) IBW/kg (Calculated) : 61.6  Temp (24hrs), Avg:97.7 F (36.5 C), Min:97.3 F (36.3 C), Max:98.1 F (36.7 C)  Recent Labs  Lab 05/13/17 2143 05/13/17 2205 05/14/17 0125 05/14/17 0444 05/16/17 0619  WBC 12.3*  --   --  12.4* 13.1*  CREATININE 1.75*  --   --  1.65* 2.16*  LATICACIDVEN  --  4.83* 2.9* 2.3*  --     Estimated Creatinine Clearance: 16.5 mL/min (A) (by C-G formula based on SCr of 2.16 mg/dL (H)).    No Known Allergies   Babs BertinHaley Tyron Manetta, PharmD, BCPS Clinical Pharmacist Rx Phone # for today: [phone] After 3:30PM, please call Main Rx: [phone] 05/16/2017 2:01 PM

## 2017-05-16 NOTE — Evaluation (Signed)
Physical Therapy Evaluation Patient Details Name: Lori PeckMargaret W Rodelo MRN: [id number] DOB: [birth year] Today's Date: 05/16/2017   History of Present Illness  83 yo female with RLE cellulitis and renal insuffciency was admitted, has severe pain and difficulty moving.  PMHx:  CAD, a-fib, SAH,   Clinical Impression  Pt was seen for evaluation of mobility after admission with BLE cellulitis and severe pain, disabling her for gait and transfers.  Pt will be seen acutely to progress mobility as her pain will allow, following up with assistance from rehab and will potentially need more help and equipment when done before going home.  Pt is in too much pain to walk far today, and will expect a progression of all this as she continues.    Follow Up Recommendations SNF    Equipment Recommendations  None recommended by PT    Recommendations for Other Services       Precautions / Restrictions Precautions Precautions: Fall Restrictions Weight Bearing Restrictions: No Other Position/Activity Restrictions: painful BLE's with WBing      Mobility  Bed Mobility Overal bed mobility: Needs Assistance Bed Mobility: Supine to Sit     Supine to sit: Mod assist     General bed mobility comments: helped pt to lift and roll legs, then to assist side to sit with LE's and trunk  Transfers Overall transfer level: Needs assistance Equipment used: Rolling walker (2 wheeled);1 person hand held assist Transfers: Sit to/from Stand Sit to Stand: Min assist         General transfer comment: assisted to power up from higher surface on bed, then to chair with nursing standing by  Ambulation/Gait Ambulation/Gait assistance: Min assist Ambulation Distance (Feet): 3 Feet Assistive device: Rolling walker (2 wheeled);1 person hand held assist Gait Pattern/deviations: Step-to pattern;Decreased stride length;Shuffle;Narrow base of support;Trunk flexed Gait velocity: reduced Gait velocity interpretation:  Below normal speed for age/gender General Gait Details: painful to use LE's but was able to unload some pressure on walker  Stairs            Wheelchair Mobility    Modified Rankin (Stroke Patients Only)       Balance Overall balance assessment: Needs assistance Sitting-balance support: Feet supported;Bilateral upper extremity supported Sitting balance-Leahy Scale: Fair     Standing balance support: Bilateral upper extremity supported;During functional activity Standing balance-Leahy Scale: Poor                               Pertinent Vitals/Pain Pain Assessment: Faces Faces Pain Scale: Hurts whole lot Pain Location: BLE's with initial standing Pain Descriptors / Indicators: Sharp;Jabbing Pain Intervention(s): Limited activity within patient's tolerance;Monitored during session;Premedicated before session;Repositioned    Home Living Family/patient expects to be discharged to:: Private residence Living Arrangements: Alone Available Help at Discharge: Personal care attendant;Available PRN/intermittently Type of Home: House Home Access: Stairs to enter Entrance Stairs-Rails: (pt is unclear but seems not to have them) Entrance Stairs-Number of Steps: 2 Home Layout: One level        Prior Function Level of Independence: Needs assistance   Gait / Transfers Assistance Needed: pt was able to stand with RW alone  ADL's / Homemaking Assistance Needed: has help at home to complete housework        Hand Dominance   Dominant Hand: Right    Extremity/Trunk Assessment   Upper Extremity Assessment Upper Extremity Assessment: Generalized weakness    Lower Extremity Assessment Lower Extremity Assessment: Generalized  weakness    Cervical / Trunk Assessment Cervical / Trunk Assessment: Kyphotic  Communication   Communication: HOH(has B hearing aids)  Cognition Arousal/Alertness: Lethargic Behavior During Therapy: Flat affect Overall Cognitive  Status: Within Functional Limits for tasks assessed                                 General Comments: pt follows directions fine but not sure of her hearing at times      General Comments      Exercises     Assessment/Plan    PT Assessment Patient needs continued PT services  PT Problem List Decreased strength;Decreased range of motion;Decreased activity tolerance;Decreased balance;Decreased mobility;Decreased coordination;Decreased knowledge of use of DME;Decreased safety awareness;Decreased knowledge of precautions;Decreased skin integrity;Pain       PT Treatment Interventions DME instruction;Gait training;Stair training;Functional mobility training;Therapeutic activities;Therapeutic exercise;Balance training;Neuromuscular re-education;Patient/family education    PT Goals (Current goals can be found in the Care Plan section)  Acute Rehab PT Goals Patient Stated Goal: none stated x to hear better with hearing aids in PT Goal Formulation: With patient Time For Goal Achievement: 05/30/17 Potential to Achieve Goals: Good    Frequency Min 3X/week   Barriers to discharge Decreased caregiver support;Inaccessible home environment stairs to enter and has decreased help due to hiring assistance    Co-evaluation               AM-PAC PT "6 Clicks" Daily Activity  Outcome Measure Difficulty turning over in bed (including adjusting bedclothes, sheets and blankets)?: Unable Difficulty moving from lying on back to sitting on the side of the bed? : Unable Difficulty sitting down on and standing up from a chair with arms (e.g., wheelchair, bedside commode, etc,.)?: Unable Help needed moving to and from a bed to chair (including a wheelchair)?: A Lot Help needed walking in hospital room?: A Lot Help needed climbing 3-5 steps with a railing? : Total 6 Click Score: 8    End of Session Equipment Utilized During Treatment: Gait belt;Oxygen Activity Tolerance: Patient  limited by fatigue;Patient limited by pain Patient left: in chair;with call bell/phone within reach;with chair alarm set;with nursing/sitter in room Nurse Communication: Mobility status(nursing in to observe transfers) PT Visit Diagnosis: Unsteadiness on feet (R26.81);Muscle weakness (generalized) (M62.81);Difficulty in walking, not elsewhere classified (R26.2);Pain Pain - Right/Left: (B) Pain - part of body: Ankle and joints of foot    Time: [id number] PT Time Calculation (min) (ACUTE ONLY): 38 min   Charges:   PT Evaluation $PT Eval Moderate Complexity: 1 Mod PT Treatments $Gait Training: 8-22 mins $Therapeutic Activity: 8-22 mins   PT G Codes:   PT G-Codes **NOT FOR INPATIENT CLASS** Functional Assessment Tool Used: AM-PAC 6 Clicks Basic Mobility    Ivar DrapeRuth E Mirko Tailor 05/16/2017, 1:01 PM   Samul Dadauth Deonta Bomberger, PT MS Acute Rehab Dept. Number: [facility] [phone] and [facility] [phone]

## 2017-05-17 LAB — CBC WITH DIFFERENTIAL/PLATELET
Basophils Absolute: 0 10*3/uL (ref 0.0–0.1)
Basophils Relative: 0 %
EOS ABS: 0 10*3/uL (ref 0.0–0.7)
Eosinophils Relative: 0 %
HCT: 44.1 % (ref 36.0–46.0)
HEMOGLOBIN: 14.2 g/dL (ref 12.0–15.0)
LYMPHS ABS: 0.3 10*3/uL — AB (ref 0.7–4.0)
LYMPHS PCT: 3 %
MCH: 30.2 pg (ref 26.0–34.0)
MCHC: 32.2 g/dL (ref 30.0–36.0)
MCV: 93.8 fL (ref 78.0–100.0)
MONOS PCT: 5 %
Monocytes Absolute: 0.4 10*3/uL (ref 0.1–1.0)
NEUTROS PCT: 92 %
Neutro Abs: 8.7 10*3/uL — ABNORMAL HIGH (ref 1.7–7.7)
Platelets: 154 10*3/uL (ref 150–400)
RBC: 4.7 MIL/uL (ref 3.87–5.11)
RDW: 15.6 % — ABNORMAL HIGH (ref 11.5–15.5)
WBC: 9.3 10*3/uL (ref 4.0–10.5)

## 2017-05-17 LAB — RENAL FUNCTION PANEL
Albumin: 2 g/dL — ABNORMAL LOW (ref 3.5–5.0)
Anion gap: 9 (ref 5–15)
BUN: 54 mg/dL — AB (ref 6–20)
CHLORIDE: 102 mmol/L (ref 101–111)
CO2: 24 mmol/L (ref 22–32)
CREATININE: 2.03 mg/dL — AB (ref 0.44–1.00)
Calcium: 8.6 mg/dL — ABNORMAL LOW (ref 8.9–10.3)
GFR calc non Af Amer: 20 mL/min — ABNORMAL LOW (ref >60)
GFR, EST AFRICAN AMERICAN: 24 mL/min — AB (ref >60)
Glucose, Bld: 96 mg/dL (ref 65–99)
POTASSIUM: 4.6 mmol/L (ref 3.5–5.1)
Phosphorus: 3.8 mg/dL (ref 2.5–4.6)
Sodium: 135 mmol/L (ref 135–145)

## 2017-05-17 LAB — GLUCOSE, CAPILLARY: GLUCOSE-CAPILLARY: 84 mg/dL (ref 65–99)

## 2017-05-17 MED ORDER — DIPHENHYDRAMINE HCL 25 MG PO CAPS
25.0000 mg | ORAL_CAPSULE | Freq: Two times a day (BID) | ORAL | Status: DC | PRN
  Administered 2017-05-17 – 2017-05-18 (×2): 25 mg via ORAL
  Filled 2017-05-17 (×3): qty 1

## 2017-05-17 MED ORDER — SULFAMETHOXAZOLE-TRIMETHOPRIM 800-160 MG PO TABS: 1.0000 | ORAL_TABLET | Freq: Every day | ORAL | Status: DC

## 2017-05-17 MED ORDER — SULFAMETHOXAZOLE-TRIMETHOPRIM 400-80 MG PO TABS
1.0000 | ORAL_TABLET | Freq: Two times a day (BID) | ORAL | Status: DC
  Administered 2017-05-17 – 2017-05-19 (×4): 1 via ORAL
  Filled 2017-05-17 (×6): qty 1

## 2017-05-17 MED ORDER — POLYETHYLENE GLYCOL 3350 17 G PO PACK
17.0000 g | PACK | Freq: Every day | ORAL | Status: DC
  Administered 2017-05-17 – 2017-05-18 (×2): 17 g via ORAL
  Filled 2017-05-17 (×3): qty 1

## 2017-05-17 MED ORDER — DIPHENHYDRAMINE HCL 25 MG PO CAPS: 25.0000 mg | ORAL_CAPSULE | Freq: Three times a day (TID) | ORAL | Status: DC | PRN

## 2017-05-17 NOTE — Progress Notes (Signed)
Warm blankets replaced on patient.

## 2017-05-17 NOTE — Progress Notes (Signed)
PROGRESS NOTE    Lorenda PeckMargaret W Zavada  ZOX:[id number] DOB: [birth year] DOA: 05/13/2017 PCP: Patient, No Pcp Per   Specialists:     Brief Narrative:   83 year old female Previous history of trauma falling to face 01/04/16 [facility] [place] Chronic atrial fibrillation prior Coumadin chads score >3 until subarachnoid hemorrhage Coronary artery disease Admitted with erythema right lower leg right lower extremity showed soft tissue edema  [facility] 2 days prior to admission with negative Doppler studies   On admit BUN/creatinine elevated creatinine 1.7 up from 1.1 WBC 12.3 Lactic acid 4.8 Started on vancomycin in the emergency room   Assessment & Plan:   Principal Problem:   Cellulitis Active Problems:   Renal insufficiency   Unspecified atrial fibrillation (HCC)   Cellulitis Secondary to nonhealing left lower extremity wounds--has had wounds in LE's for the past 6-8 months--she is managed --has been draining and looking worse 3-4 months--[place] Started on vancomycin which was stopped on 11/25 continue on Zosyn-De-escalated 11/27 to PO clindamycin ABIs cannot be checked per Vasc tech as would cause compression of already ulcerated wound--can get OP testing wound care consult appreciated mepitel silicone dressing on RLE to prevent trauma to skin  Lactic acidosis Probably secondary to moderate dehydration + sepsis Repeat lactic acid from 4.8-->2.3 currently  Acute kidney injury Renal insufficiency baseline creatinine 1.1-->2.3 now Hydrate with normal saline 100 cc/h Creatinine about the same Incontinent of urine passing in bed  Chr Afib not on AC sys murmur 4/6 Rate controlled at this time [facility]   DVT prophylaxis: lovenox Code Status:  full Family Communication:  No family today Disposition Plan: await resolution  creatinine Will nee SNF Needs therapy eval   Consultants:    woc nurse  Procedures:    abi pending  Antimicrobials:   Vancomycin and zosyn    Subjective:  In some distress and moaning groanin Given percocet earlier today without much effect rn asking about something to calm--however becomes agitated and risky for confusion  Objective: Vitals:   05/17/17 0400 05/17/17 0443 05/17/17 0645 05/17/17 1047  BP: (!) 160/100  (!) 146/82 (!) 181/91  Pulse: 87  (!) 103 (!) 112  Resp: 20  20 20   Temp: (!) 96 F (35.6 C)  (!) 97 F (36.1 C) 97.7 F (36.5 C)  TempSrc: Rectal  Rectal Oral  SpO2: 96%   95%  Weight:  89.8 kg (197 lb 15.6 oz)    Height:        Intake/Output Summary (Last 24 hours) at 05/17/2017 1231 Last data filed at 05/17/2017 0513 Gross per 24 hour  Intake 1465.75 ml  Output 200 ml  Net 1265.75 ml   Filed Weights   05/13/17 2028 05/15/17 0508 05/17/17 0443  Weight: 63.5 kg (140 lb) 62.9 kg (138 lb 10.7 oz) 89.8 kg (197 lb 15.6 oz)    Examination:  Alert-somewhat agiated Arcus-no pallor no ict  cta b abd sfot nt nd no rebound Wound     Data Reviewed: I have personally reviewed following labs and imaging studies  CBC: Recent Labs  Lab 05/13/17 2143 05/14/17 0444 05/16/17 0619 05/17/17 0546  WBC 12.3* 12.4* 13.1* 9.3  NEUTROABS 11.3* 11.6* 12.3* 8.7*  HGB 14.7 13.3 13.5 14.2  HCT 45.3 40.3 40.8 44.1  MCV 96.0 94.2 92.3 93.8  PLT 140* 153 141* 154   Basic Metabolic Panel: Recent  Labs  Lab 05/13/17 2143 05/14/17 0444 05/16/17 0619 05/17/17 0546  NA 137 137 135 135  K 5.1 5.1 4.2 4.6  CL 103 106 103 102  CO2 26 24 22 24   GLUCOSE 87 88 104* 96  BUN 33* 35* 51* 54*  CREATININE 1.75* 1.65* 2.16* 2.03*  CALCIUM 9.5 8.7* 8.4* 8.6*  PHOS  --   --  3.8 3.8   GFR: Estimated Creatinine Clearance: 20.8 mL/min (A) (by C-G formula based on SCr of 2.03 mg/dL (H)). Liver Function Tests: Recent Labs  Lab 05/16/17 0619 05/17/17 0546   ALBUMIN 2.0* 2.0*   No results for input(s): LIPASE, AMYLASE in the last 168 hours. No results for input(s): AMMONIA in the last 168 hours. Coagulation Profile: No results for input(s): INR, PROTIME in the last 168 hours. Cardiac Enzymes: No results for input(s): CKTOTAL, CKMB, CKMBINDEX, TROPONINI in the last 168 hours. BNP (last 3 results) No results for input(s): PROBNP in the last 8760 hours. HbA1C: No results for input(s): HGBA1C in the last 72 hours. CBG: Recent Labs  Lab 05/16/17 0813 05/17/17 0739  GLUCAP 103* 84   Lipid Profile: No results for input(s): CHOL, HDL, LDLCALC, TRIG, CHOLHDL, LDLDIRECT in the last 72 hours. Thyroid Function Tests: No results for input(s): TSH, T4TOTAL, FREET4, T3FREE, THYROIDAB in the last 72 hours. Anemia Panel: No results for input(s): VITAMINB12, FOLATE, FERRITIN, TIBC, IRON, RETICCTPCT in the last 72 hours. Urine analysis: No results found for: COLORURINE, APPEARANCEUR, LABSPEC, PHURINE, GLUCOSEU, HGBUR, BILIRUBINUR, KETONESUR, PROTEINUR, UROBILINOGEN, NITRITE, LEUKOCYTESUR   Radiology Studies: Reviewed images personally in health database    Scheduled Meds: . apixaban  2.5 mg Oral BID  . clindamycin  300 mg Oral Q8H  . collagenase   Topical Daily  . feeding supplement (ENSURE ENLIVE)  237 mL Oral BID BM  . polyethylene glycol  17 g Oral Daily   Continuous Infusions: . sodium chloride 75 mL/hr at 05/17/17 0404     LOS: 4 days    Time spent: 6115    Pleas KochJai Lenisha Lacap, MD [facility] (P) [phone]   If 7PM-7AM, please contact night-coverage [email or web address] Password [email or web address] 05/17/2017, 12:31 PM

## 2017-05-17 NOTE — Consult Note (Signed)
Consult requested for left leg wound.  This was already performed on 11/25; refer to previous WOC consult note for assessment and plan of care has been provided for bedside nurses. Please re-consult if further assistance is needed.  Thank-you,  Cammie Mcgeeawn Lafayette Dunlevy MSN, RN, CWOCN, [place]CN-AP, CNS [phone]

## 2017-05-17 NOTE — Progress Notes (Signed)
Notified [facility] Kirby via text page of patient's temp 96.0 rectally, BP 160/100 manually, HR 87, R 20, O2 96% on RA. Orders to place warm blankets on patient every 30 mins to one hour. Warm blankets placed on patient; tylenol given for patient's pain. Will recheck patient's temperature and pain level.

## 2017-05-17 NOTE — Progress Notes (Signed)
Pt continuously moaning out, removing.gown and covers and agitated.  Pt states, "It won't come out", but couldn't tell staff what she meant.  Noted lower abdominal distention.  VS stable.  MD notified.  Lori Whitney, Suzi Hernan LindsayLindsay

## 2017-05-17 NOTE — Care Management Important Message (Signed)
Important Message  Patient Details  Name: Lori PeckMargaret W Sarria MRN: [id number] Date of Birth: 09/07/1923   Medicare Important Message Given:  Yes    Dorena BodoIris Eladio Dentremont 05/17/2017, 2:01 PM

## 2017-05-17 NOTE — Progress Notes (Signed)
Patient's BP 174/100. Recheck 165/95 manually. Pt denies any cp. Paged on call. Awaiting for response.

## 2017-05-17 NOTE — Progress Notes (Signed)
Vital signs rechecked. Temp 97.0 rectally and BP 146/82. Warm blankets replaced on patient.

## 2017-05-18 DIAGNOSIS — N179 Acute kidney failure, unspecified: Secondary | ICD-10-CM

## 2017-05-18 DIAGNOSIS — I10 Essential (primary) hypertension: Secondary | ICD-10-CM

## 2017-05-18 LAB — RENAL FUNCTION PANEL
ALBUMIN: 1.9 g/dL — AB (ref 3.5–5.0)
Anion gap: 7 (ref 5–15)
BUN: 46 mg/dL — AB (ref 6–20)
CALCIUM: 8.5 mg/dL — AB (ref 8.9–10.3)
CO2: 23 mmol/L (ref 22–32)
Chloride: 106 mmol/L (ref 101–111)
Creatinine, Ser: 1.58 mg/dL — ABNORMAL HIGH (ref 0.44–1.00)
GFR calc Af Amer: 32 mL/min — ABNORMAL LOW (ref >60)
GFR calc non Af Amer: 27 mL/min — ABNORMAL LOW (ref >60)
GLUCOSE: 59 mg/dL — AB (ref 65–99)
PHOSPHORUS: 2.5 mg/dL (ref 2.5–4.6)
Potassium: 4.6 mmol/L (ref 3.5–5.1)
SODIUM: 136 mmol/L (ref 135–145)

## 2017-05-18 LAB — GLUCOSE, CAPILLARY
Glucose-Capillary: 62 mg/dL — ABNORMAL LOW (ref 65–99)
Glucose-Capillary: 112 mg/dL — ABNORMAL HIGH (ref 65–99)
Glucose-Capillary: 125 mg/dL — ABNORMAL HIGH (ref 65–99)

## 2017-05-18 MED ORDER — TRAMADOL HCL 50 MG PO TABS
50.0000 mg | ORAL_TABLET | Freq: Three times a day (TID) | ORAL | Status: DC | PRN
  Administered 2017-05-18 (×2): 50 mg via ORAL
  Filled 2017-05-18 (×3): qty 1

## 2017-05-18 MED ORDER — FENTANYL CITRATE (PF) 100 MCG/2ML IJ SOLN: 12.5000 ug | Freq: Once | INTRAMUSCULAR | Status: DC | PRN

## 2017-05-18 MED ORDER — BRIMONIDINE TARTRATE 0.15 % OP SOLN
1.0000 [drp] | Freq: Two times a day (BID) | OPHTHALMIC | Status: DC
  Administered 2017-05-18 – 2017-05-19 (×3): 1 [drp] via OPHTHALMIC
  Filled 2017-05-18: qty 5

## 2017-05-18 MED ORDER — METOPROLOL TARTRATE 50 MG PO TABS
50.0000 mg | ORAL_TABLET | Freq: Two times a day (BID) | ORAL | Status: DC
  Administered 2017-05-18 – 2017-05-19 (×3): 50 mg via ORAL
  Filled 2017-05-18 (×3): qty 1

## 2017-05-18 MED ORDER — LATANOPROST 0.005 % OP SOLN
1.0000 [drp] | Freq: Every day | OPHTHALMIC | Status: DC
  Administered 2017-05-18: 1 [drp] via OPHTHALMIC
  Filled 2017-05-18: qty 2.5

## 2017-05-18 NOTE — Progress Notes (Signed)
[facility] PROGRESS NOTE  Lori Whitney UJW:[id number] DOB: [birth year] DOA: 05/13/2017  PCP: Patient, No Pcp Per  Brief History/Interval Summary: 83 year old female with a past medical history of subarachnoid hemorrhage in 2017, chronic atrial fibrillation noted to be on Eliquis, coronary artery disease was admitted for management of right lower extremity cellulitis.  Reason for Visit: Right lower extremity cellulitis  Consultants: None  Procedures: None  Antibiotics: Initially given vancomycin.  Now noted to be on Bactrim.  Subjective/Interval History: Patient pleasantly confused.  Unable to provide much information.  Complains of some pain in her right leg.  ROS: Denies any nausea or vomiting  Objective:  Vital Signs  Vitals:   05/17/17 1438 05/17/17 2143 05/17/17 2200 05/18/17 0328  BP: (!) 188/97 (!) 174/100 (!) 165/95 (!) 153/82  Pulse: 99 (!) 103  98  Resp: 20 (!) 22  20  Temp: 98 F (36.7 C) 97.6 F (36.4 C)  (!) 97.3 F (36.3 C)  TempSrc: Oral Axillary  Axillary  SpO2: 95% 96%  97%  Weight:    89.3 kg (196 lb 13.9 oz)  Height:        Intake/Output Summary (Last 24 hours) at 05/18/2017 1204 Last data filed at 05/18/2017 0329 Gross per 24 hour  Intake 180 ml  Output -  Net 180 ml   Filed Weights   05/15/17 0508 05/17/17 0443 05/18/17 0328  Weight: 62.9 kg (138 lb 10.7 oz) 89.8 kg (197 lb 15.6 oz) 89.3 kg (196 lb 13.9 oz)    General appearance: alert, cooperative, distracted and no distress Head: Normocephalic, without obvious abnormality, atraumatic Resp: clear to auscultation bilaterally Cardio: regular rate and rhythm, S1, S2 normal, no murmur, click, rub or gallop GI: soft, non-tender; bowel sounds normal; no masses,  no organomegaly Extremities: Denuded area noted in the right lower extremity.  Currently covered in dressing. mild erythema. Neurologic: Pleasantly confused and disoriented.  No obvious focal neurological  deficits  Lab Results:  Data Reviewed: I have personally reviewed following labs and imaging studies  CBC: Recent Labs  Lab 05/13/17 2143 05/14/17 0444 05/16/17 0619 05/17/17 0546  WBC 12.3* 12.4* 13.1* 9.3  NEUTROABS 11.3* 11.6* 12.3* 8.7*  HGB 14.7 13.3 13.5 14.2  HCT 45.3 40.3 40.8 44.1  MCV 96.0 94.2 92.3 93.8  PLT 140* 153 141* 154    Basic Metabolic Panel: Recent Labs  Lab 05/13/17 2143 05/14/17 0444 05/16/17 0619 05/17/17 0546 05/18/17 0753  NA 137 137 135 135 136  K 5.1 5.1 4.2 4.6 4.6  CL 103 106 103 102 106  CO2 26 24 22 24 23   GLUCOSE 87 88 104* 96 59*  BUN 33* 35* 51* 54* 46*  CREATININE 1.75* 1.65* 2.16* 2.03* 1.58*  CALCIUM 9.5 8.7* 8.4* 8.6* 8.5*  PHOS  --   --  3.8 3.8 2.5    GFR: Estimated Creatinine Clearance: 26.6 mL/min (A) (by C-G formula based on SCr of 1.58 mg/dL (H)).  Liver Function Tests: Recent Labs  Lab 05/16/17 5621 05/17/17 0546 05/18/17 0753  ALBUMIN 2.0* 2.0* 1.9*   CBG: Recent Labs  Lab 05/16/17 0813 05/17/17 0739 05/18/17 0811 05/18/17 1114  GLUCAP 103* 84 62* 112*     Recent Results (from the past 240 hour(s))  Culture, blood (Routine X 2) w Reflex to ID Panel     Status: Abnormal   Collection Time: 05/13/17 10:35 PM  Result Value Ref Range Status   Specimen Description BLOOD RIGHT ARM UPPER  Final  Special Requests   Final    BOTTLES DRAWN AEROBIC ONLY Blood Culture adequate volume   Culture  Setup Time   Final    GRAM NEGATIVE RODS AEROBIC BOTTLE ONLY CRITICAL RESULT CALLED TO, READ BACK BY AND VERIFIED WITH: E SINCLAIR,PHARMD AT 1930 05/14/17 BY Z ROBINSON    Culture MORGANELLA MORGANII (A)  Final   Report Status 05/16/2017 FINAL  Final   Organism ID, Bacteria MORGANELLA MORGANII  Final      Susceptibility   Morganella morganii - MIC*    AMPICILLIN >=32 RESISTANT Resistant     CEFAZOLIN >=64 RESISTANT Resistant     CEFEPIME <=1 SENSITIVE Sensitive     CEFTAZIDIME <=1 SENSITIVE Sensitive      CEFTRIAXONE <=1 SENSITIVE Sensitive     CIPROFLOXACIN <=0.25 SENSITIVE Sensitive     GENTAMICIN <=1 SENSITIVE Sensitive     IMIPENEM 2 SENSITIVE Sensitive     TRIMETH/SULFA <=20 SENSITIVE Sensitive     AMPICILLIN/SULBACTAM 8 SENSITIVE Sensitive     PIP/TAZO <=4 SENSITIVE Sensitive     * MORGANELLA MORGANII  Blood Culture ID Panel (Reflexed)     Status: None   Collection Time: 05/13/17 10:35 PM  Result Value Ref Range Status   Enterococcus species NOT DETECTED NOT DETECTED Final    Comment: CRITICAL RESULT CALLED TO, READ BACK BY AND VERIFIED WITH: EMILY SINCLAIR @ 1930 ON 05/14/17 BY ROBINSON Z.     Listeria monocytogenes NOT DETECTED NOT DETECTED Final   Staphylococcus species NOT DETECTED NOT DETECTED Final   Staphylococcus aureus NOT DETECTED NOT DETECTED Final   Streptococcus species NOT DETECTED NOT DETECTED Final   Streptococcus agalactiae NOT DETECTED NOT DETECTED Final   Streptococcus pneumoniae NOT DETECTED NOT DETECTED Final   Streptococcus pyogenes NOT DETECTED NOT DETECTED Final   Acinetobacter baumannii NOT DETECTED NOT DETECTED Final   Enterobacteriaceae species NOT DETECTED NOT DETECTED Final   Enterobacter cloacae complex NOT DETECTED NOT DETECTED Final   Escherichia coli NOT DETECTED NOT DETECTED Final   Klebsiella oxytoca NOT DETECTED NOT DETECTED Final   Klebsiella pneumoniae NOT DETECTED NOT DETECTED Final   Proteus species NOT DETECTED NOT DETECTED Final   Serratia marcescens NOT DETECTED NOT DETECTED Final   Haemophilus influenzae NOT DETECTED NOT DETECTED Final   Neisseria meningitidis NOT DETECTED NOT DETECTED Final   Pseudomonas aeruginosa NOT DETECTED NOT DETECTED Final   Candida albicans NOT DETECTED NOT DETECTED Final   Candida glabrata NOT DETECTED NOT DETECTED Final   Candida krusei NOT DETECTED NOT DETECTED Final   Candida parapsilosis NOT DETECTED NOT DETECTED Final   Candida tropicalis NOT DETECTED NOT DETECTED Final  Culture, blood (Routine X 2)  w Reflex to ID Panel     Status: None (Preliminary result)   Collection Time: 05/13/17 10:50 PM  Result Value Ref Range Status   Specimen Description BLOOD LEFT ANTECUBITAL  Final   Special Requests   Final    BOTTLES DRAWN AEROBIC AND ANAEROBIC Blood Culture adequate volume   Culture NO GROWTH 3 DAYS  Final   Report Status PENDING  Incomplete      Radiology Studies: No results found.   Medications:  Scheduled: . apixaban  2.5 mg Oral BID  . collagenase   Topical Daily  . feeding supplement (ENSURE ENLIVE)  237 mL Oral BID BM  . polyethylene glycol  17 g Oral Daily  . sulfamethoxazole-trimethoprim  1 tablet Oral Q12H   Continuous: . sodium chloride Stopped (05/18/17 0223)   BJY:[id number]:acetaminophen **OR**  acetaminophen, bisacodyl, diphenhydrAMINE, fentaNYL (SUBLIMAZE) injection, ondansetron **OR** ondansetron (ZOFRAN) IV, senna-docusate, traMADol  Assessment/Plan:  Principal Problem:   Cellulitis Active Problems:   Renal insufficiency   Unspecified atrial fibrillation (HCC)    Cellulitis involving lateral lower extremities, right more than left/lower extremity wounds Patient was noted to have blisters in the right leg.  Patient seen by wound care nurse.  Dressing changes.  There was also an element of cellulitis.  Patient started initially on vancomycin and Zosyn.  Patient with positive blood cultures for Morganella morganii.  Sensitivities reviewed.  Patient transitioned over to oral Bactrim.  WBC has improved.  She is afebrile.  Lactic acidosis Possibly elevated due to sepsis.  Improved.  Acute kidney injury Creatinine noted to increase from 1.75-2.16.  Improved to 1.58 today.  Continue to monitor urine output.  Check labs tomorrow.  Chronic atrial fibrillation Patient is noted to be on Eliquis.  She has a previous history of subarachnoid hemorrhage in 2017.  Discussed with patient's son and he confirmed that the patient was taking Eliquis.  Since renal function is  improving we will leave her on the current dose for now.  Resume beta-blocker.  Essential hypertension Blood pressure noted to be elevated.  Resume her beta-blocker.  Holding ARB due to elevated creatinine.   DVT Prophylaxis: On Eliquis    Code Status: Full code Family Communication: Discussed with patient's son Disposition Plan: Will go to skilled nursing facility when ready for discharge    LOS: 5 days   Osvaldo ShipperGokul Laakea Pereira  [facility] Pager [phone] 05/18/2017, 12:04 PM  If 7PM-7AM, please contact night-coverage at [email or web address], password [email or web address]

## 2017-05-18 NOTE — Progress Notes (Signed)
Pt continuously groaning and moaning. Very agitated. VSS. C/o of pain on her BLE, PRN tylenol given with no relief. Pt keep taking her gown and blankets off and complaining that she's cold. Pulling IV lines and getting IV pole in the bed with her. IV SL for pt's safety. Made on call aware. New order received. Will continue to monitor pt.

## 2017-05-18 NOTE — NC FL2 (Signed)
[place] MEDICAID FL2 LEVEL OF CARE SCREENING TOOL     IDENTIFICATION  Patient Name: Lori Whitney Birthdate: [birth year] Sex: female Admission Date (Current Location): 05/13/2017  [facility] [place]   Facility and Address:  [facility], [address], [place], [place] [address]      Provider Number: [id number]  Attending Physician Name and Address:  Osvaldo ShipperKrishnan, Gokul, MD  Relative Name and Phone Number:  Neysa Hotterlan Grupe, son, [phone]    Current Level of Care: Hospital Recommended Level of Care: Skilled Nursing Facility Prior Approval Number:    Date Approved/Denied:   [place]  Discharge Plan: SNF    Current Diagnoses: Patient Active Problem List   Diagnosis Date Noted  . Cellulitis 05/13/2017  . Renal insufficiency 05/13/2017  . Unspecified atrial fibrillation (HCC) 05/13/2017  . CAD (coronary artery disease) 05/13/2017    Orientation RESPIRATION BLADDER Height & Weight     Self, Time  Normal Incontinent, Indwelling catheter Weight: 196 lb 13.9 oz (89.3 kg) Height:  5\' 7"  (170.2 cm)  BEHAVIORAL SYMPTOMS/MOOD NEUROLOGICAL BOWEL NUTRITION STATUS      Continent Diet(See DC Summary)  AMBULATORY STATUS COMMUNICATION OF NEEDS Skin   Limited Assist Verbally Skin abrasions(Right Leg Cellulitis)                       Personal Care Assistance Level of Assistance  Bathing, Feeding, Dressing Bathing Assistance: Limited assistance Feeding assistance: Limited assistance Dressing Assistance: Limited assistance     Functional Limitations Info  Sight, Hearing, Speech Sight Info: Adequate Hearing Info: Adequate Speech Info: Adequate    SPECIAL CARE FACTORS FREQUENCY  PT (By licensed PT)     PT Frequency: 3x week              Contractures Contractures Info: Not present    Additional Factors Info  Code Status, Allergies Code Status Info: Full Allergies Info: No Known Allergies            Current Medications (05/18/2017):  This is the current hospital active medication list Current Facility-Administered Medications  Medication Dose Route Frequency Provider Last Rate Last Dose  . 0.9 %  sodium chloride infusion   Intravenous Continuous Osvaldo ShipperKrishnan, Gokul, MD 50 mL/hr at 05/18/17 1358    . acetaminophen (TYLENOL) tablet 650 mg  650 mg Oral Q6H PRN Opyd, Lavone Neriimothy S, MD   650 mg at 05/18/17 1046   Or  . acetaminophen (TYLENOL) suppository 650 mg  650 mg Rectal Q6H PRN Opyd, Lavone Neriimothy S, MD      . apixaban (ELIQUIS) tablet 2.5 mg  2.5 mg Oral BID Opyd, Lavone Neriimothy S, MD   2.5 mg at 05/18/17 1046  . bisacodyl (DULCOLAX) EC tablet 5 mg  5 mg Oral Daily PRN Opyd, Lavone Neriimothy S, MD      . brimonidine (ALPHAGAN) 0.15 % ophthalmic solution 1 drop  1 drop Left Eye BID Osvaldo ShipperKrishnan, Gokul, MD   1 drop at 05/18/17 1825  . collagenase (SANTYL) ointment   Topical Daily Samtani, Jai-Gurmukh, MD      . feeding supplement (ENSURE ENLIVE) (ENSURE ENLIVE) liquid 237 mL  237 mL Oral BID BM Samtani, Jai-Gurmukh, MD   237 mL at 05/18/17 1400  . fentaNYL (SUBLIMAZE) injection 12.5 mcg  12.5 mcg Intravenous Once PRN Kirby-Graham, Beather ArbourKaren J, NP      . latanoprost (XALATAN) 0.005 % ophthalmic solution 1 drop  1 drop Both Eyes QHS Osvaldo ShipperKrishnan, Gokul, MD      .  metoprolol tartrate (LOPRESSOR) tablet 50 mg  50 mg Oral BID Osvaldo ShipperKrishnan, Gokul, MD   50 mg at 05/18/17 1431  . ondansetron (ZOFRAN) tablet 4 mg  4 mg Oral Q6H PRN Opyd, Lavone Neriimothy S, MD       Or  . ondansetron (ZOFRAN) injection 4 mg  4 mg Intravenous Q6H PRN Opyd, Lavone Neriimothy S, MD      . polyethylene glycol (MIRALAX / GLYCOLAX) packet 17 g  17 g Oral Daily Rhetta MuraSamtani, Jai-Gurmukh, MD   17 g at 05/18/17 1047  . senna-docusate (Senokot-S) tablet 1 tablet  1 tablet Oral QHS PRN Opyd, Lavone Neriimothy S, MD      . sulfamethoxazole-trimethoprim (BACTRIM,SEPTRA) 400-80 MG per tablet 1 tablet  1 tablet Oral Q12H Rhetta MuraSamtani, Jai-Gurmukh, MD   1 tablet at 05/18/17 1046  . traMADol (ULTRAM)  tablet 50 mg  50 mg Oral Q8H PRN Leda GauzeKirby-Graham, Karen J, NP   50 mg at 05/18/17 1431     Discharge Medications: Please see discharge summary for a list of discharge medications.  Relevant Imaging Results:  Relevant Lab Results:   Additional Information SS#: [id number]  Tresa MoorePatricia V Carling Liberman, LCSW

## 2017-05-18 NOTE — Social Work (Addendum)
CSW met with patient at bedside today at 4:00pm. Pt indicated that CSW shld contact son, Antony Haste, to discuss discharge plan. Pt oriented to person and time. Pt appeared confused and pleasant.  CSW will follow up with family on disposition.  Elissa Hefty, LCSW Clinical Social Worker [phone]

## 2017-05-19 DIAGNOSIS — L03115 Cellulitis of right lower limb: Principal | ICD-10-CM

## 2017-05-19 LAB — CULTURE, BLOOD (ROUTINE X 2)
CULTURE: NO GROWTH
SPECIAL REQUESTS: ADEQUATE

## 2017-05-19 LAB — BASIC METABOLIC PANEL
ANION GAP: 6 (ref 5–15)
BUN: 40 mg/dL — ABNORMAL HIGH (ref 6–20)
CHLORIDE: 108 mmol/L (ref 101–111)
CO2: 23 mmol/L (ref 22–32)
Calcium: 8.3 mg/dL — ABNORMAL LOW (ref 8.9–10.3)
Creatinine, Ser: 1.4 mg/dL — ABNORMAL HIGH (ref 0.44–1.00)
GFR calc Af Amer: 37 mL/min — ABNORMAL LOW (ref >60)
GFR, EST NON AFRICAN AMERICAN: 32 mL/min — AB (ref >60)
GLUCOSE: 80 mg/dL (ref 65–99)
POTASSIUM: 4.5 mmol/L (ref 3.5–5.1)
Sodium: 137 mmol/L (ref 135–145)

## 2017-05-19 LAB — CBC
HEMATOCRIT: 40.4 % (ref 36.0–46.0)
HEMOGLOBIN: 13.3 g/dL (ref 12.0–15.0)
MCH: 30.3 pg (ref 26.0–34.0)
MCHC: 32.9 g/dL (ref 30.0–36.0)
MCV: 92 fL (ref 78.0–100.0)
Platelets: 157 10*3/uL (ref 150–400)
RBC: 4.39 MIL/uL (ref 3.87–5.11)
RDW: 15.5 % (ref 11.5–15.5)
WBC: 6.3 10*3/uL (ref 4.0–10.5)

## 2017-05-19 LAB — GLUCOSE, CAPILLARY: Glucose-Capillary: 80 mg/dL (ref 65–99)

## 2017-05-19 MED ORDER — TRAMADOL HCL 50 MG PO TABS: 50.0000 mg | ORAL_TABLET | Freq: Three times a day (TID) | ORAL | 0 refills | Status: AC | PRN

## 2017-05-19 MED ORDER — SULFAMETHOXAZOLE-TRIMETHOPRIM 400-80 MG PO TABS
1.0000 | ORAL_TABLET | Freq: Two times a day (BID) | ORAL | Status: AC
Start: 2017-05-19 — End: 2017-05-24

## 2017-05-19 MED ORDER — POLYETHYLENE GLYCOL 3350 17 G PO PACK: 17.0000 g | PACK | Freq: Every day | ORAL | 0 refills | Status: AC | PRN

## 2017-05-19 MED ORDER — ENSURE ENLIVE PO LIQD: 237.0000 mL | Freq: Two times a day (BID) | ORAL | 12 refills | Status: AC

## 2017-05-19 MED ORDER — ACETAMINOPHEN 325 MG PO TABS: 650.0000 mg | ORAL_TABLET | Freq: Four times a day (QID) | ORAL | Status: AC | PRN

## 2017-05-19 NOTE — Discharge Summary (Signed)
[facility]  Physician Discharge Summary   Patient ID: Lori Whitney MRN: [id number] DOB/AGE: Aug 04, 1923 83 y.o.  Admit date: 05/13/2017 Discharge date: 05/19/2017  PCP: Patient, No Pcp Per  DISCHARGE DIAGNOSES:  Principal Problem:   Cellulitis Active Problems:   Renal insufficiency   Unspecified atrial fibrillation (HCC)   RECOMMENDATIONS FOR OUTPATIENT FOLLOW UP: 1. Please perform wound care on the lower extremities as mentioned below. 2. Check CBC and basic metabolic panel in 1 week. 3. Her ARB is being held due to elevated creatinine.  Monitor blood pressures at the skilled nursing facility and use alternative agents if needed. 4. Follow-up with her primary care provider or provider at the skilled nursing facility within 1 week  Wound care instructions Dressing procedure/placement/frequency:Cleanse left leg with NS.  Apply silicone border foam to nonintact lesion.  Change every three days and PRN soilage Cleanse right leg with soap and water.  Apply Santyl to wound bed right anterior leg wound below knee.  COver with NS moist gauze.  Secure with 4x4 gauze and kerlix.  Mepitel silicone contact layer to wounds on medial and lateral aspects of leg for atrauamtic dressing removal.  Cover with 4x4 gauze and kerlix.  Change daily.     DISCHARGE CONDITION: fair  Diet recommendation: Heart healthy  Filed Weights   05/17/17 0443 05/18/17 0328 05/19/17 0445  Weight: 89.8 kg (197 lb 15.6 oz) 89.3 kg (196 lb 13.9 oz) 88.4 kg (194 lb 14.2 oz)    INITIAL HISTORY: 83 year old female with a past medical history of subarachnoid hemorrhage in 2017, chronic atrial fibrillation noted to be on Eliquis, coronary artery disease was admitted for management of right lower extremity cellulitis.   HOSPITAL COURSE:   Cellulitis involving lateral lower extremities, right more than left/lower extremity wounds Patient was noted to have blisters in the right leg.  Patient seen by  wound care nurse.  There was also an element of cellulitis.  Patient started initially on vancomycin and Zosyn.  Patient with positive blood cultures for Morganella morganii. Sensitivities reviewed.  Patient transitioned over to oral Bactrim.  WBC has improved.  She is afebrile now.  She will need continued wound care at the skilled nursing facility.  Lactic acidosis Possibly elevated due to sepsis.  Improved.  Acute kidney injury Creatinine noted to increase from 1.75-2.16.    She was gently hydrated.  Creatinine has improved to 1.4.  Continue to monitor urine output.  Check labs in 1 week.  Chronic atrial fibrillation Patient is noted to be on Eliquis.  She has a previous history of subarachnoid hemorrhage in 2017.  Discussed with patient's son and he confirmed that the patient was taking Eliquis.  Since renal function is improving we will leave her on the current dose for now.    Continue her beta-blocker.  Essential hypertension Continue metoprolol. Holding ARB due to elevated creatinine.  Monitor blood pressures at the skilled nursing facility.  If poorly controlled, use alternative agents.  Overall stable.  Patient has improved.  She will need short-term rehab at skilled nursing facility.  Okay for discharge today.     PERTINENT LABS:  The results of significant diagnostics from this hospitalization (including imaging, microbiology, ancillary and laboratory) are listed below for reference.    Microbiology: Recent Results (from the past 240 hour(s))  Culture, blood (Routine X 2) w Reflex to ID Panel     Status: Abnormal   Collection Time: 05/13/17 10:35 PM  Result Value Ref Range Status  Specimen Description BLOOD RIGHT ARM UPPER  Final   Special Requests   Final    BOTTLES DRAWN AEROBIC ONLY Blood Culture adequate volume   Culture  Setup Time   Final    GRAM NEGATIVE RODS AEROBIC BOTTLE ONLY CRITICAL RESULT CALLED TO, READ BACK BY AND VERIFIED WITH: E SINCLAIR,PHARMD AT  1930 05/14/17 BY Z ROBINSON    Culture MORGANELLA MORGANII (A)  Final   Report Status 05/16/2017 FINAL  Final   Organism ID, Bacteria MORGANELLA MORGANII  Final      Susceptibility   Morganella morganii - MIC*    AMPICILLIN >=32 RESISTANT Resistant     CEFAZOLIN >=64 RESISTANT Resistant     CEFEPIME <=1 SENSITIVE Sensitive     CEFTAZIDIME <=1 SENSITIVE Sensitive     CEFTRIAXONE <=1 SENSITIVE Sensitive     CIPROFLOXACIN <=0.25 SENSITIVE Sensitive     GENTAMICIN <=1 SENSITIVE Sensitive     IMIPENEM 2 SENSITIVE Sensitive     TRIMETH/SULFA <=20 SENSITIVE Sensitive     AMPICILLIN/SULBACTAM 8 SENSITIVE Sensitive     PIP/TAZO <=4 SENSITIVE Sensitive     * MORGANELLA MORGANII  Blood Culture ID Panel (Reflexed)     Status: None   Collection Time: 05/13/17 10:35 PM  Result Value Ref Range Status   Enterococcus species NOT DETECTED NOT DETECTED Final    Comment: CRITICAL RESULT CALLED TO, READ BACK BY AND VERIFIED WITH: EMILY SINCLAIR @ 1930 ON 05/14/17 BY ROBINSON Z.     Listeria monocytogenes NOT DETECTED NOT DETECTED Final   Staphylococcus species NOT DETECTED NOT DETECTED Final   Staphylococcus aureus NOT DETECTED NOT DETECTED Final   Streptococcus species NOT DETECTED NOT DETECTED Final   Streptococcus agalactiae NOT DETECTED NOT DETECTED Final   Streptococcus pneumoniae NOT DETECTED NOT DETECTED Final   Streptococcus pyogenes NOT DETECTED NOT DETECTED Final   Acinetobacter baumannii NOT DETECTED NOT DETECTED Final   Enterobacteriaceae species NOT DETECTED NOT DETECTED Final   Enterobacter cloacae complex NOT DETECTED NOT DETECTED Final   Escherichia coli NOT DETECTED NOT DETECTED Final   Klebsiella oxytoca NOT DETECTED NOT DETECTED Final   Klebsiella pneumoniae NOT DETECTED NOT DETECTED Final   Proteus species NOT DETECTED NOT DETECTED Final   Serratia marcescens NOT DETECTED NOT DETECTED Final   Haemophilus influenzae NOT DETECTED NOT DETECTED Final   Neisseria meningitidis NOT  DETECTED NOT DETECTED Final   Pseudomonas aeruginosa NOT DETECTED NOT DETECTED Final   Candida albicans NOT DETECTED NOT DETECTED Final   Candida glabrata NOT DETECTED NOT DETECTED Final   Candida krusei NOT DETECTED NOT DETECTED Final   Candida parapsilosis NOT DETECTED NOT DETECTED Final   Candida tropicalis NOT DETECTED NOT DETECTED Final  Culture, blood (Routine X 2) w Reflex to ID Panel     Status: None (Preliminary result)   Collection Time: 05/13/17 10:50 PM  Result Value Ref Range Status   Specimen Description BLOOD LEFT ANTECUBITAL  Final   Special Requests   Final    BOTTLES DRAWN AEROBIC AND ANAEROBIC Blood Culture adequate volume   Culture NO GROWTH 4 DAYS  Final   Report Status PENDING  Incomplete     Labs: Basic Metabolic Panel: Recent Labs  Lab 05/14/17 0444 05/16/17 0619 05/17/17 0546 05/18/17 0753 05/19/17 0431  NA 137 135 135 136 137  K 5.1 4.2 4.6 4.6 4.5  CL 106 103 102 106 108  CO2 24 22 24 23 23   GLUCOSE 88 104* 96 59* 80  BUN 35* 51*  54* 46* 40*  CREATININE 1.65* 2.16* 2.03* 1.58* 1.40*  CALCIUM 8.7* 8.4* 8.6* 8.5* 8.3*  PHOS  --  3.8 3.8 2.5  --    Liver Function Tests: Recent Labs  Lab 05/16/17 0619 05/17/17 0546 05/18/17 0753  ALBUMIN 2.0* 2.0* 1.9*   CBC: Recent Labs  Lab 05/13/17 2143 05/14/17 0444 05/16/17 0619 05/17/17 0546 05/19/17 0431  WBC 12.3* 12.4* 13.1* 9.3 6.3  NEUTROABS 11.3* 11.6* 12.3* 8.7*  --   HGB 14.7 13.3 13.5 14.2 13.3  HCT 45.3 40.3 40.8 44.1 40.4  MCV 96.0 94.2 92.3 93.8 92.0  PLT 140* 153 141* 154 157    CBG: Recent Labs  Lab 05/17/17 0739 05/18/17 0811 05/18/17 1114 05/18/17 1319 05/19/17 0737  GLUCAP 84 62* 112* 125* 80     IMAGING STUDIES Dg Tibia/fibula Right Port  Result Date: 05/13/2017 CLINICAL DATA:  Right leg erythema. Right leg swelling and increased redness. Recent evaluation at outside institution for same. EXAM: PORTABLE RIGHT TIBIA AND FIBULA - 2 VIEW COMPARISON:  None.  FINDINGS: There is no evidence of fracture or other focal bone lesions. Degenerative changes of the knee with chondrocalcinosis. No bony destructive change. Diffuse soft tissue edema with more focal soft tissue prominence about the medial soft tissues of the distal lower leg. There are vascular calcifications. No definite soft tissue air. IMPRESSION: Soft tissue edema. More focal soft tissue prominence about the medial distal lower leg is nonspecific, could reflect abscess in the appropriate clinical setting. No acute osseous abnormalities. Electronically Signed   By: Rubye OaksMelanie  Ehinger M.D.   On: 05/13/2017 22:29    DISCHARGE EXAMINATION: Vitals:   05/18/17 1436 05/18/17 2020 05/19/17 0445 05/19/17 1027  BP: (!) 149/85 (!) 144/82 (!) 152/84 (!) 144/96  Pulse: (!) 106 99 88 89  Resp:  17 15   Temp: 97.7 F (36.5 C) 97.9 F (36.6 C) 98 F (36.7 C)   TempSrc: Oral Axillary Axillary   SpO2: 99% 98% 99% 100%  Weight:   88.4 kg (194 lb 14.2 oz)   Height:       General appearance: alert, cooperative, appears stated age, distracted and no distress Resp: clear to auscultation bilaterally Cardio: regular rate and rhythm, S1, S2 normal, no murmur, click, rub or gallop GI: soft, non-tender; bowel sounds normal; no masses,  no organomegaly Extremities: venous stasis dermatitis noted and Right lower extremity covered in dressing.  DISPOSITION: SNF  Discharge Instructions    Call MD for:  extreme fatigue   Complete by:  As directed    Call MD for:  persistant dizziness or light-headedness   Complete by:  As directed    Call MD for:  persistant nausea and vomiting   Complete by:  As directed    Call MD for:  redness, tenderness, or signs of infection (pain, swelling, redness, odor or green/yellow discharge around incision site)   Complete by:  As directed    Call MD for:  temperature >100.4   Complete by:  As directed    Discharge instructions   Complete by:  As directed    Please review  instructions on the discharge summary.  You were cared for by a hospitalist during your hospital stay. If you have any questions about your discharge medications or the care you received while you were in the hospital after you are discharged, you can call the unit and asked to speak with the hospitalist on call if the hospitalist that took care of you is not  available. Once you are discharged, your primary care physician will handle any further medical issues. Please note that NO REFILLS for any discharge medications will be authorized once you are discharged, as it is imperative that you return to your primary care physician (or establish a relationship with a primary care physician if you do not have one) for your aftercare needs so that they can reassess your need for medications and monitor your lab values. If you do not have a primary care physician, you can call [phone] for a physician referral.   Increase activity slowly   Complete by:  As directed       ALLERGIES: No Known Allergies    Allergies as of 05/19/2017   No Known Allergies     Medication List    STOP taking these medications   furosemide 20 MG tablet Commonly known as:  LASIX   losartan 50 MG tablet Commonly known as:  COZAAR   potassium chloride SA 20 MEQ tablet Commonly known as:  K-DUR,KLOR-CON     TAKE these medications   acetaminophen 325 MG tablet Commonly known as:  TYLENOL Take 2 tablets (650 mg total) by mouth every 6 (six) hours as needed for mild pain (or Fever >/= 101).   brimonidine 0.1 % Soln Commonly known as:  ALPHAGAN P Place 1 drop into the left eye 2 (two) times daily.   CALCIUM PO Take 1 tablet by mouth daily.   ELIQUIS 2.5 MG Tabs tablet Generic drug:  apixaban Take 2.5 mg by mouth 2 (two) times daily.   feeding supplement (ENSURE ENLIVE) Liqd Take 237 mLs by mouth 2 (two) times daily between meals.   latanoprost 0.005 % ophthalmic solution Commonly known as:  XALATAN Place 1  drop into both eyes at bedtime.   LINZESS 145 MCG Caps capsule Generic drug:  linaclotide Take 145 mcg by mouth daily before breakfast.   metoprolol succinate 100 MG 24 hr tablet Commonly known as:  TOPROL-XL Take 100 mg by mouth daily. Take with or immediately following a meal.   polyethylene glycol packet Commonly known as:  MIRALAX / GLYCOLAX Take 17 g by mouth daily as needed.   sulfamethoxazole-trimethoprim 400-80 MG tablet Commonly known as:  BACTRIM,SEPTRA Take 1 tablet by mouth every 12 (twelve) hours for 5 days.   traMADol 50 MG tablet Commonly known as:  ULTRAM Take 1 tablet (50 mg total) by mouth every 8 (eight) hours as needed for moderate pain (or Headache unrelieved by tylenol).        TOTAL DISCHARGE TIME: 35 minutes  Osvaldo ShipperGokul Daimion Adamcik  [facility] Pager [phone]  05/19/2017, 11:52 AM

## 2017-05-19 NOTE — Social Work (Signed)
CSW called Neysa BonitoChristy in admissions at [facility] to f/u and see if they can offer patient a SNF bed. CSW left message and will f/u. Family would prefer [facility] as patient has been to that SNF in the past.  Keene BreathPatricia Decklyn Hornik, LCSW Clinical Social Worker [phone]

## 2017-05-19 NOTE — Discharge Instructions (Signed)

## 2017-05-19 NOTE — Progress Notes (Signed)
Report was called [place] [facility]. Patient was given discharge instructions and prepared to be transported at this time. Patient in stable condition upon discharge.

## 2017-05-19 NOTE — Social Work (Signed)
Clinical Social Worker facilitated patient discharge including contacting patient family and facility to confirm patient discharge plans.  Clinical information faxed to facility and family agreeable with plan.   CSW arranged ambulance transport via [facility] to [facility] in [place].    RN to call [phone], (press "0" and ask for Rehab 2, Pt going to Room 104) report prior to discharge.  Clinical Social Worker will sign off for now as social work intervention is no longer needed. Please consult us again if new need arises.  Keene BreathPatricia Joretta Eads, LCSW Clinical Social Worker [phone]

## 2017-05-19 NOTE — Clinical Social Work Note (Signed)
Clinical Social Work Assessment  Patient Details  Name: Lori Whitney MRN: [id number] Date of Birth: 04/05/1924  Date of referral:  05/19/17               Reason for consult:  Facility Placement                Permission sought to share information with:  Facility Industrial/product designerContact Representative Permission granted to share information::  Yes, Release of Information Signed  Name::     Neysa Hotterlan Dobler  Agency::  SNF  Relationship::     Contact Information:     Housing/Transportation Living arrangements for the past 2 months:  Single Family Home Source of Information:  Adult Children, Patient Patient Interpreter Needed:  None Criminal Activity/Legal Involvement Pertinent to Current Situation/Hospitalization:  No - Comment as needed Significant Relationships:  Adult Children, Other Family Members Lives with:  Self Do you feel safe going back to the place where you live?  No Need for family participation in patient care:  Yes (Comment)  Care giving concerns:  CSW went to meet with patient yesterday however patient only alert to person and time. Pt from home alone, per son. He confirmed that patient had a home health aid about 4 days a week. She is unsafe to return home at this time and they are [facility]. CSW discussed the long term plan and family will consider after she complete short term rehab. Son prefers [facility] as patient has been there before for skilled nursing needs.  Social Worker assessment / plan:  CSW will assist with SNF placement in [place] area as that is where patient resides. CSW will assist with transport when medically ready.   Employment status:  Retired Health and safety inspectornsurance information:  Medicare PT Recommendations:  Skilled Nursing Facility Information / Referral to community resources:  Skilled Nursing Facility  Patient/Family's Response to care:  Family appreciative of CSW f/u on SNF placement. No issues or concerns identified.  Patient/Family's  Understanding of and Emotional Response to Diagnosis, Current Treatment, and Prognosis:  Family has good understanding of diagnosis, current treatment and prognosis as the son in agreement with clinical team's recommendation for short term rehab. [place]. CSW will f/u and assist with placement. CSW started the dialogue about the patient's long term plans and son will be addressing as well. No issues or concerns identified.  Emotional Assessment Appearance:  Appears stated age Attitude/Demeanor/Rapport:  (Cooperative) Affect (typically observed):  Accepting, Appropriate Orientation:  Oriented to Self, Oriented to  Time Alcohol / Substance use:  Not Applicable Psych involvement (Current and /or in the community):  No (Comment)  Discharge Needs  Concerns to be addressed:  Discharge Planning Concerns Readmission within the last 30 days:  No Current discharge risk:  Physical Impairment, Lives alone, Dependent with Mobility Barriers to Discharge:  No Barriers Identified   Tresa Mooreatricia V Eleonore Shippee, LCSW 05/19/2017, 10:06 AM

## 2017-05-19 NOTE — Clinical Social Work Placement (Signed)
   CLINICAL SOCIAL WORK PLACEMENT  NOTE  Date:  05/19/2017  Patient Details  Name: Lori PeckMargaret W Elkhatib MRN: [id number] Date of Birth: 1924/05/02  Clinical Social Work is seeking post-discharge placement for this patient at the Skilled  Nursing Facility level of care (*CSW will initial, date and re-position this form in  chart as items are completed):  Yes   Patient/family provided with [facility] Clinical Social Work Department's list of facilities offering this level of care within the geographic area requested by the patient (or if unable, by the patient's family).  Yes   Patient/family informed of their freedom to choose among providers that offer the needed level of care, that participate in Medicare, Medicaid or managed care program needed by the patient, have an available bed and are willing to accept the patient.  Yes   Patient/family informed of [facility]'s ownership interest in [facility] and [facility], as well as of the fact that they are under no obligation to receive care at these facilities.  PASRR submitted to EDS on       PASRR number received on       Existing PASRR number confirmed on       FL2 transmitted to all facilities in geographic area requested by pt/family on       FL2 transmitted to all facilities within larger geographic area on       Patient informed that his/her managed care company has contracts with or will negotiate with certain facilities, including the following:        Yes   Patient/family informed of bed offers received.  [facility]     Physician recommends and patient chooses bed at      [facility] 05/19/17.  Patient to be transferred to facility by [facility]     Patient family notified on 05/19/17 of transfer.  Name of family member notified:  son notified     PHYSICIAN       Additional Comment:     _______________________________________________ Tresa MoorePatricia V Avleen Bordwell, LCSW 05/19/2017, 3:18 PM

## 2017-05-19 NOTE — Progress Notes (Signed)
Physical Therapy Treatment Patient Details Name: Lori Whitney MRN: [id number] DOB: June 06, 1924 Today's Date: 05/19/2017    History of Present Illness 83 yo female with RLE cellulitis and renal insuffciency was admitted, has severe pain and difficulty moving.  PMHx:  CAD, a-fib, SAH,     PT Comments    Pt initially declining out of bed mobility 2/2 pain in LEs R>L, but agreeable to bed level therapy as tolerated.  LLE noted to be weeping with sheets wet, provided pt with gauze dressing and absorbant pad below LE to reduce risk of moisture related skin breakdown.   Pt completes 8-10 reps of therex as noted below, good strength in LEs and UEs thought somewhat limited by pain.  Pt left positioned to comfort in bed, call bell in reach and needs met.   Follow Up Recommendations  SNF     Equipment Recommendations  None recommended by PT    Recommendations for Other Services       Precautions / Restrictions Precautions Precautions: Fall Restrictions Weight Bearing Restrictions: No    Mobility  Bed Mobility                  Transfers                    Ambulation/Gait                 Stairs            Wheelchair Mobility    Modified Rankin (Stroke Patients Only)       Balance                                            Cognition Arousal/Alertness: Awake/alert Behavior During Therapy: WFL for tasks assessed/performed Overall Cognitive Status: Within Functional Limits for tasks assessed                                        Exercises General Exercises - Upper Extremity Shoulder Flexion: AROM;Both;10 reps;Supine Elbow Flexion: AROM;Strengthening;Both;10 reps;Supine(therapist resistance) Elbow Extension: AROM;Strengthening;Both;10 reps;Supine(thearpist resistance) General Exercises - Lower Extremity Ankle Circles/Pumps: AROM;10 reps;Supine;Both Quad Sets: AROM;Both;10 reps;Supine Gluteal Sets:  AROM;Both;10 reps;Supine    General Comments        Pertinent Vitals/Pain Pain Assessment: No/denies pain Pain Location: reports pain in BLEs R>L with weight bearing    Home Living                      Prior Function            PT Goals (current goals can now be found in the care plan section) Acute Rehab PT Goals PT Goal Formulation: With patient Time For Goal Achievement: 05/30/17 Potential to Achieve Goals: Good Progress towards PT goals: Progressing toward goals    Frequency    Min 3X/week      PT Plan      Co-evaluation              AM-PAC PT "6 Clicks" Daily Activity  Outcome Measure  Difficulty turning over in bed (including adjusting bedclothes, sheets and blankets)?: A Little Difficulty moving from lying on back to sitting on the side of the bed? : A Lot Difficulty sitting down on and standing up from a  chair with arms (e.g., wheelchair, bedside commode, etc,.)?: A Lot Help needed moving to and from a bed to chair (including a wheelchair)?: A Lot Help needed walking in hospital room?: A Lot Help needed climbing 3-5 steps with a railing? : Total 6 Click Score: 12    End of Session   Activity Tolerance: Patient tolerated treatment well Patient left: in bed;with bed alarm set   PT Visit Diagnosis: Unsteadiness on feet (R26.81);Muscle weakness (generalized) (M62.81);Difficulty in walking, not elsewhere classified (R26.2);Pain     Time: 6433-2951 PT Time Calculation (min) (ACUTE ONLY): 13 min  Charges:  $Therapeutic Exercise: 8-22 mins                    G Codes:         Shann Medal, PT, DPT  05/19/2017, 3:29 PM

## 2017-10-19 DEATH — deceased

## 2018-06-05 IMAGING — DX DG TIBIA/FIBULA PORT 2V*R*
1 series · 4 of 4 positions shown · non-contrast
Comparison: None.

CLINICAL DATA: Right leg erythema. Right leg swelling and increased
redness. Recent evaluation at outside institution for same.

EXAM:
PORTABLE RIGHT TIBIA AND FIBULA - 2 VIEW

[Series 1: leg · 0.14mm/px · 4 of 4 slices shown]
[im 1/4]
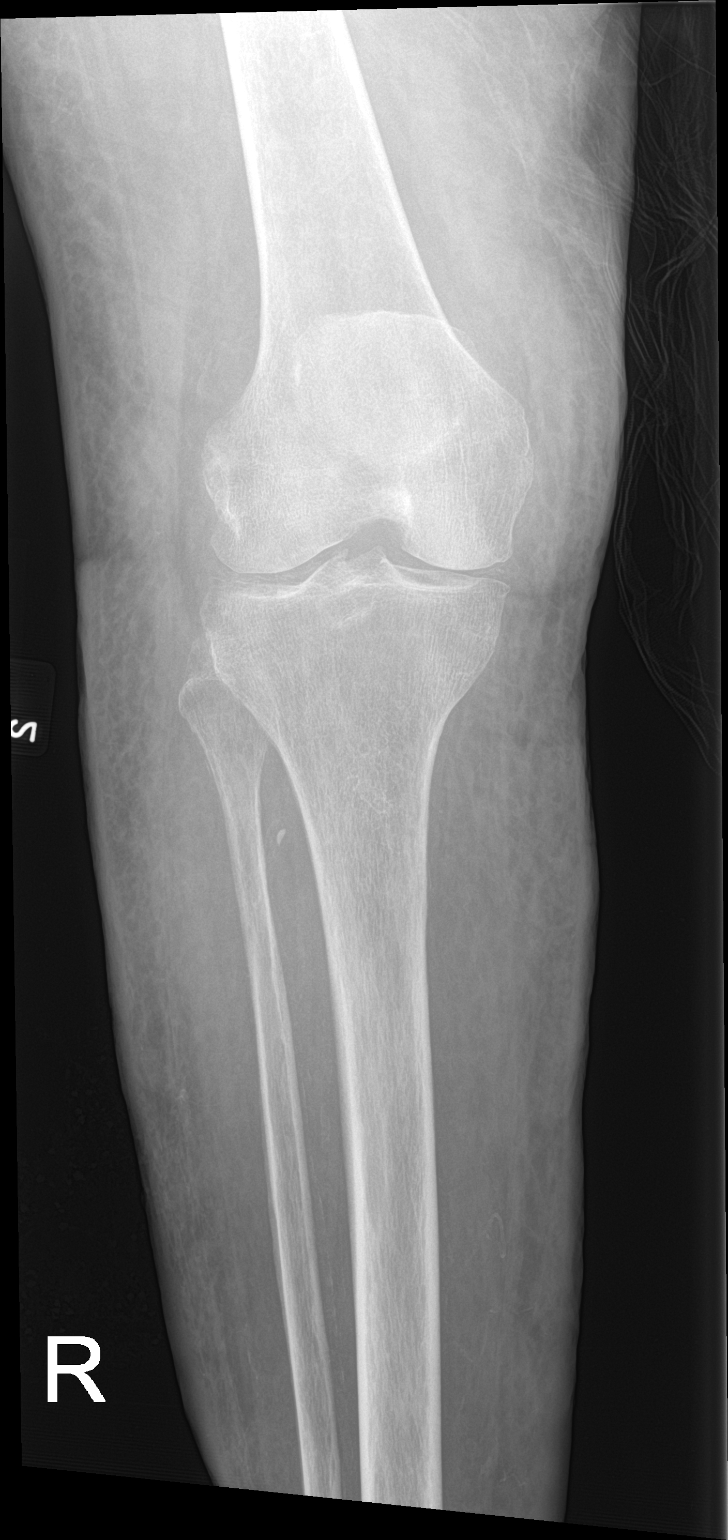
[im 2/4]
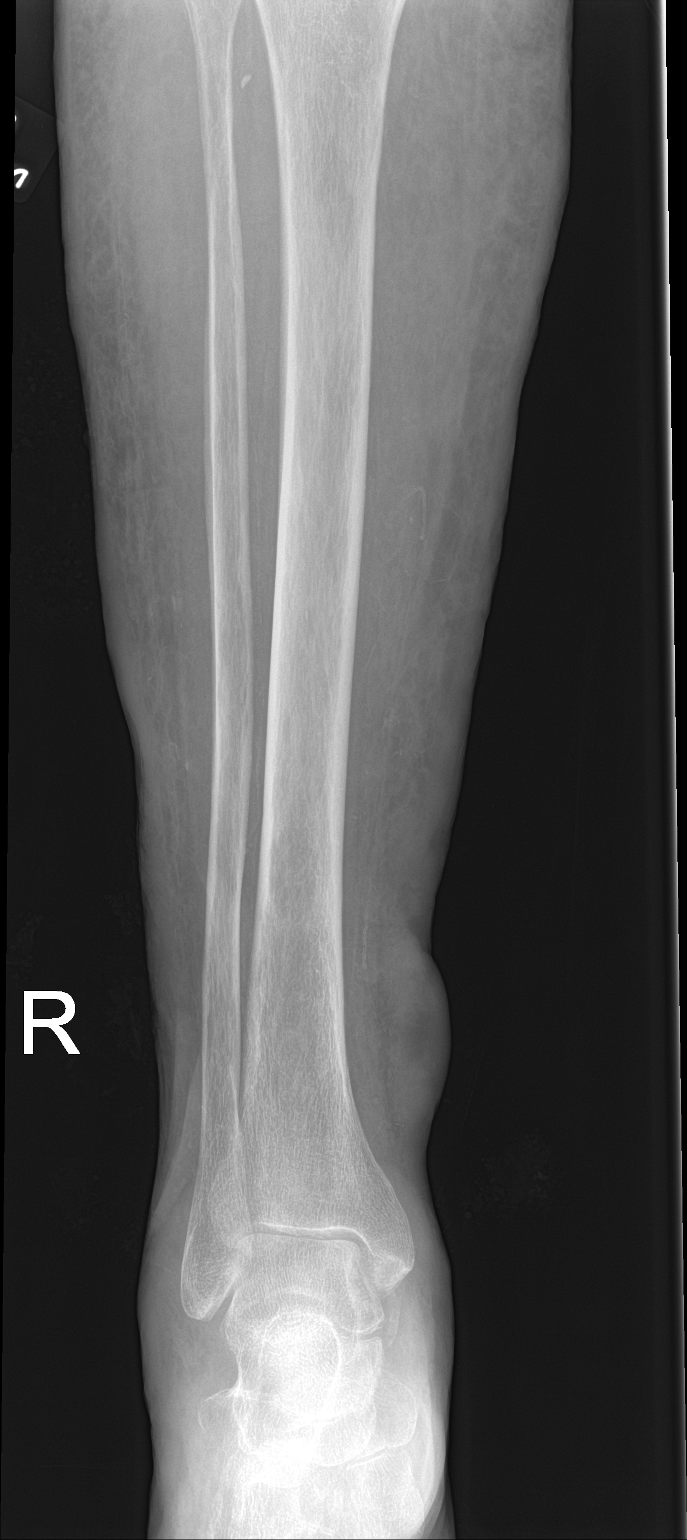
[im 3/4]
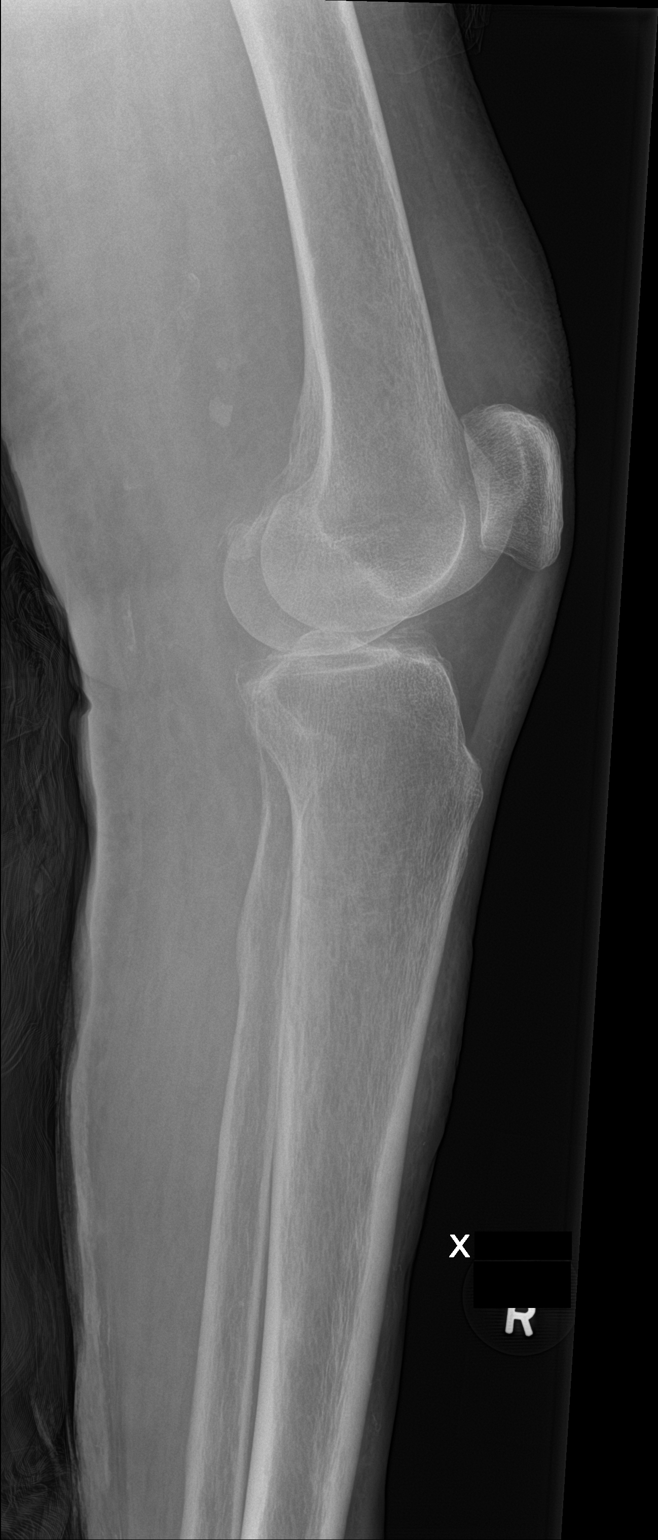
[im 4/4]
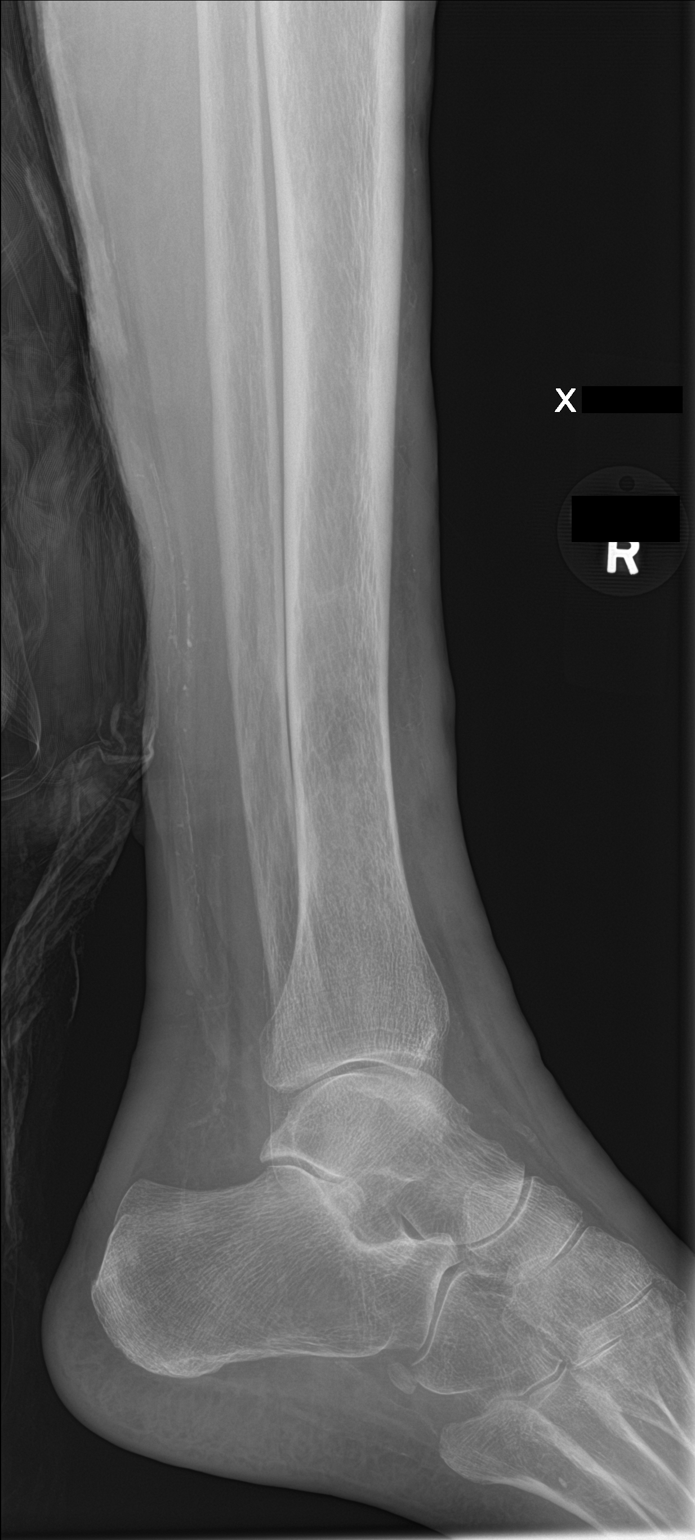

[4 of 4 positions shown; findings below may reference images not displayed]

FINDINGS: There is no evidence of fracture or other focal bone lesions.
Degenerative changes of the knee with chondrocalcinosis. No bony
destructive change. Diffuse soft tissue edema with more focal soft
tissue prominence about the medial soft tissues of the distal lower
leg. There are vascular calcifications. No definite soft tissue air.
IMPRESSION: Soft tissue edema. More focal soft tissue prominence about the
medial distal lower leg is nonspecific, could reflect abscess in the
appropriate clinical setting. No acute osseous abnormalities.
